# Patient Record
Sex: Female | Born: 1940 | Race: White | Hispanic: No | Smoking: Never smoker
Health system: Southern US, Community
[De-identification: ages and names within clinical notes are randomized; demographics above are authoritative.]

## PROBLEM LIST (undated history)

## (undated) DIAGNOSIS — M899 Disorder of bone, unspecified: Secondary | ICD-10-CM

## (undated) DIAGNOSIS — K759 Inflammatory liver disease, unspecified: Secondary | ICD-10-CM

## (undated) DIAGNOSIS — H9120 Sudden idiopathic hearing loss, unspecified ear: Secondary | ICD-10-CM

## (undated) DIAGNOSIS — L989 Disorder of the skin and subcutaneous tissue, unspecified: Secondary | ICD-10-CM

## (undated) DIAGNOSIS — M949 Disorder of cartilage, unspecified: Secondary | ICD-10-CM

## (undated) DIAGNOSIS — E785 Hyperlipidemia, unspecified: Secondary | ICD-10-CM

## (undated) DIAGNOSIS — Z9189 Other specified personal risk factors, not elsewhere classified: Secondary | ICD-10-CM

## (undated) DIAGNOSIS — M1990 Unspecified osteoarthritis, unspecified site: Secondary | ICD-10-CM

## (undated) HISTORY — DX: Other specified personal risk factors, not elsewhere classified: Z91.89

## (undated) HISTORY — PX: OTHER SURGICAL HISTORY: SHX169

## (undated) HISTORY — DX: Hyperlipidemia, unspecified: E78.5

## (undated) HISTORY — PX: ABDOMINAL HYSTERECTOMY: SHX81

## (undated) HISTORY — DX: Unspecified osteoarthritis, unspecified site: M19.90

## (undated) HISTORY — DX: Disorder of bone, unspecified: M89.9

## (undated) HISTORY — PX: BREAST EXCISIONAL BIOPSY: SUR124

## (undated) HISTORY — PX: OOPHORECTOMY: SHX86

## (undated) HISTORY — PX: TONSILLECTOMY: SUR1361

## (undated) HISTORY — DX: Inflammatory liver disease, unspecified: K75.9

## (undated) HISTORY — DX: Disorder of cartilage, unspecified: M94.9

## (undated) HISTORY — DX: Sudden idiopathic hearing loss, unspecified ear: H91.20

## (undated) HISTORY — DX: Disorder of the skin and subcutaneous tissue, unspecified: L98.9

---

## 2001-07-27 ENCOUNTER — Encounter: Admission: RE | Admit: 2001-07-27 | Discharge: 2001-07-27 | Payer: Self-pay | Admitting: *Deleted

## 2001-07-27 ENCOUNTER — Encounter: Payer: Self-pay | Admitting: *Deleted

## 2002-08-10 ENCOUNTER — Emergency Department (HOSPITAL_COMMUNITY): Admission: EM | Admit: 2002-08-10 | Discharge: 2002-08-10 | Payer: Self-pay | Admitting: Emergency Medicine

## 2003-09-25 ENCOUNTER — Encounter: Admission: RE | Admit: 2003-09-25 | Discharge: 2003-09-25 | Payer: Self-pay | Admitting: Internal Medicine

## 2004-10-15 ENCOUNTER — Encounter: Admission: RE | Admit: 2004-10-15 | Discharge: 2004-10-15 | Payer: Self-pay | Admitting: Internal Medicine

## 2005-08-29 LAB — HM COLONOSCOPY: HM Colonoscopy: NORMAL

## 2005-09-10 ENCOUNTER — Ambulatory Visit (HOSPITAL_COMMUNITY): Admission: RE | Admit: 2005-09-10 | Discharge: 2005-09-10 | Payer: Self-pay | Admitting: Gastroenterology

## 2005-10-16 ENCOUNTER — Encounter: Admission: RE | Admit: 2005-10-16 | Discharge: 2005-10-16 | Payer: Self-pay | Admitting: Internal Medicine

## 2006-10-08 IMAGING — RF DG BE W/ AIR HIGH DENSITY
10 series · 10 of 10 positions shown · non-contrast
Comparison: none

CLINICAL DATA: Incomplete colonoscopy. 
 AIR CONTRAST BARIUM ENEMA HIGH DENSITY:

[Series 1: run · 1 of 1 slices shown (1 of 10)]
[im 1/1]
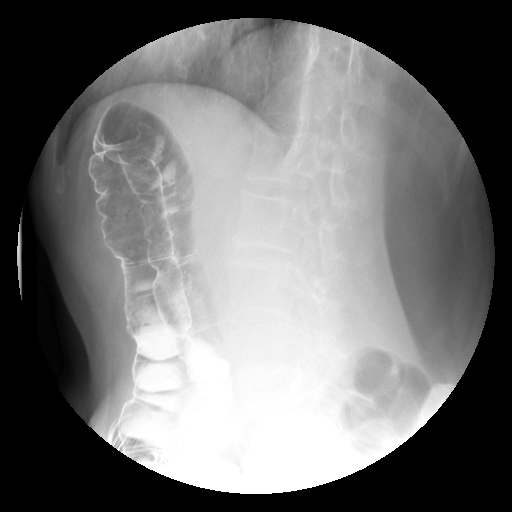

[Series 2: run · 1 of 1 slices shown (2 of 10)]
[im 1/1]
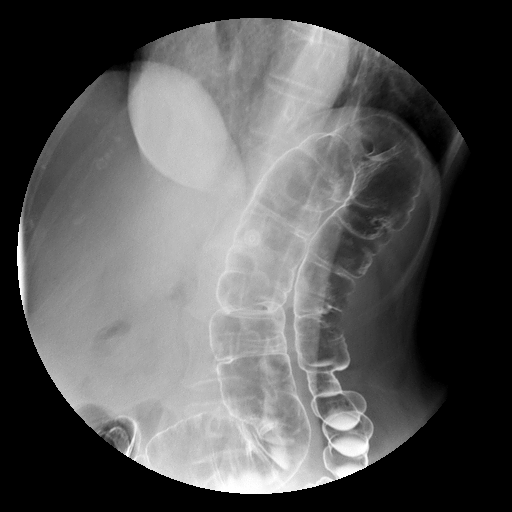

[Series 3: run · 1 of 1 slices shown (3 of 10)]
[im 1/1]
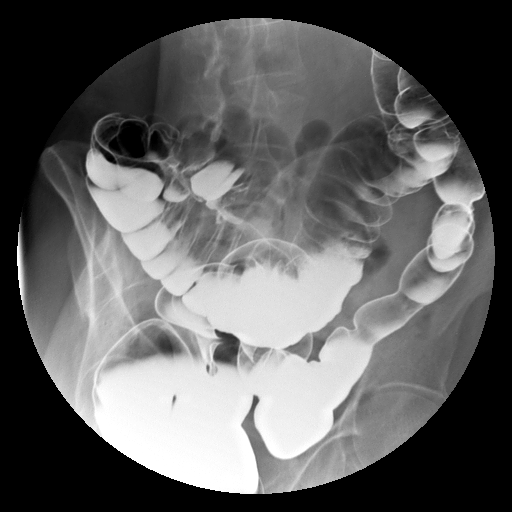

[Series 4: run · 1 of 1 slices shown (4 of 10)]
[im 1/1]
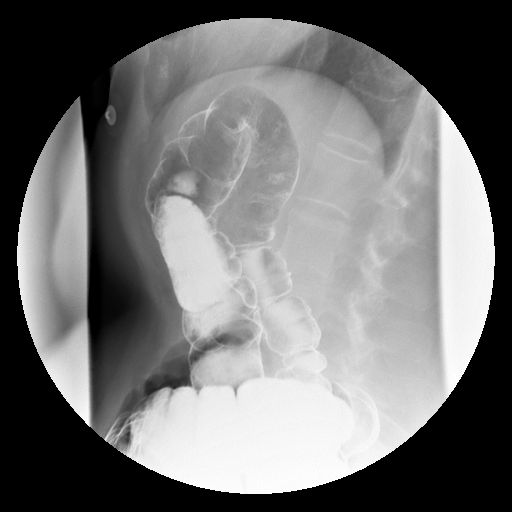

[Series 5: run · 1 of 1 slices shown (5 of 10)]
[im 1/1]
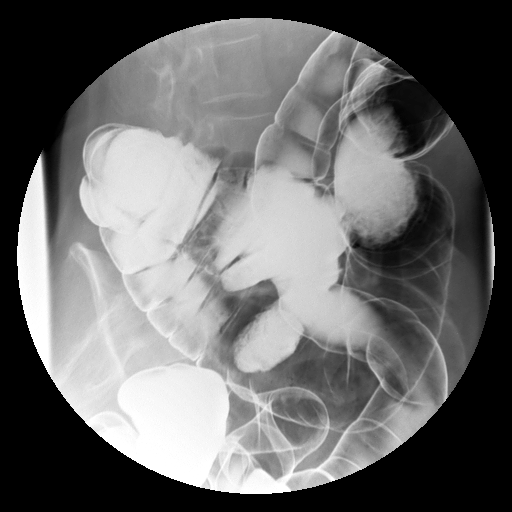

[Series 6: run · 1 of 1 slices shown (6 of 10)]
[im 1/1]
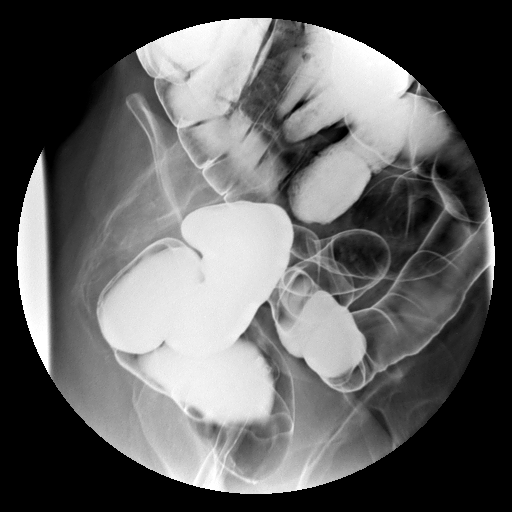

[Series 7: run · 1 of 1 slices shown (7 of 10)]
[im 1/1]
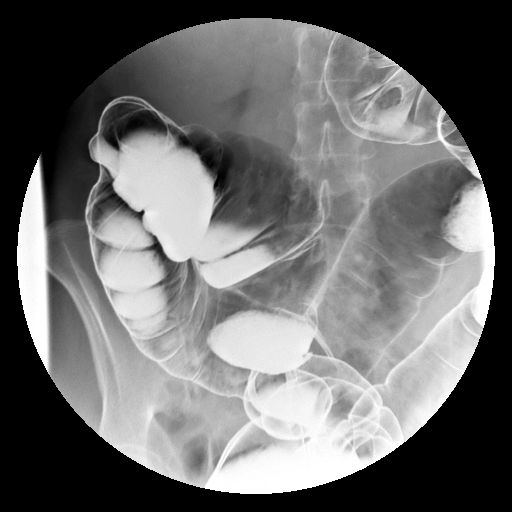

[Series 8: run · 1 of 1 slices shown (8 of 10)]
[im 1/1]
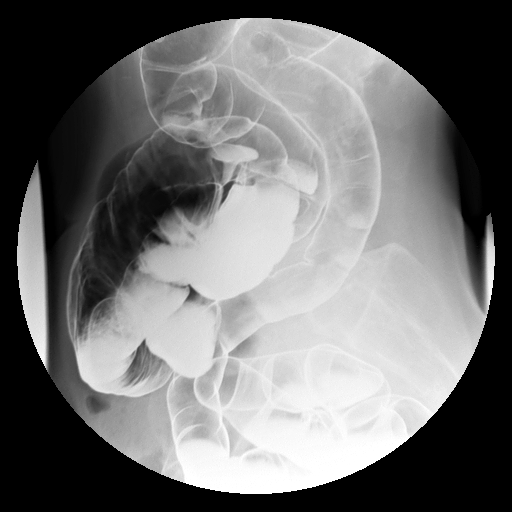

[Series 9: run · 1 of 1 slices shown (9 of 10)]
[im 1/1]
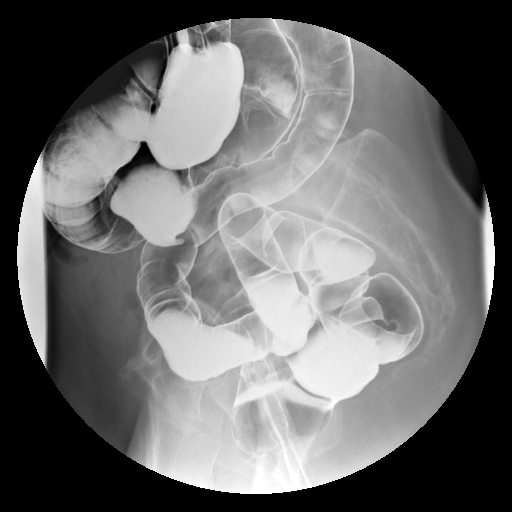

[Series 10: run · 1 of 1 slices shown (10 of 10)]
[im 1/1]
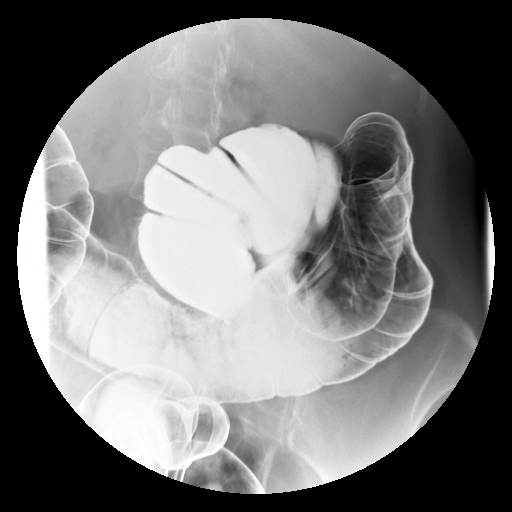

[10 of 10 positions shown; findings below may reference images not displayed]

FINDINGS: Flat and erect scout films prior to the introduction of contrast showed no evidence of free air within the abdomen.  There was considerable gas within the colon.  There is a 10 x 18 mm calcification associated with the left upper quadrant of the abdomen of uncertain etiology.  
 With the aid of fluoroscopic visualization, barium was retrogradely passed through the entire colon.  The cecum appears to be somewhat mobile.  There was no constricting lesion.  There were some filling defects which appeared to be probably retained stool.  
 The patient was allowed to evacuate most of the barium and air was introduced giving good air contrast visualization of the colon.  There was, on one or two pictures, a filling defect in the posterior aspect of the lower sigmoid just above the rectosigmoid junction and appears probably to be fecal material and certainly was visualized on the colonoscopy which was said to have passed to 70 cm.  There also is suggestion of one or two very small diverticula in the region of the lower sigmoid colon.  The cecum is seen to be mobile but is well visualized and appears normal.  There appeared to be no reflux of the terminal ileum.  
 Post evacuation films showed considerable retention of air and barium but no definite abnormality.
IMPRESSION: Few small widely scattered diverticula.  Some retained fecal material as noted above.  Mobile cecum.  No constricting lesion.  No reflux of the terminal ileum.

## 2006-10-20 ENCOUNTER — Encounter: Admission: RE | Admit: 2006-10-20 | Discharge: 2006-10-20 | Payer: Self-pay | Admitting: Internal Medicine

## 2007-06-08 ENCOUNTER — Ambulatory Visit: Payer: Self-pay | Admitting: Internal Medicine

## 2007-06-08 LAB — CONVERTED CEMR LAB
ALT: 17 units/L (ref 0–35)
BUN: 8 mg/dL (ref 6–23)
Basophils Absolute: 0 10*3/uL (ref 0.0–0.1)
Basophils Relative: 0.3 % (ref 0.0–1.0)
Calcium: 9.1 mg/dL (ref 8.4–10.5)
Chloride: 104 meq/L (ref 96–112)
Direct LDL: 99.9 mg/dL
Eosinophils Relative: 0.2 % (ref 0.0–5.0)
HDL: 88.2 mg/dL (ref >39.0)
Lymphocytes Relative: 29.7 % (ref 12.0–46.0)
MCV: 90.7 fL (ref 78.0–100.0)
Monocytes Relative: 9.4 % (ref 3.0–11.0)
Neutrophils Relative %: 60.4 % (ref 43.0–77.0)
RBC: 4.4 M/uL (ref 3.87–5.11)
RDW: 12.6 % (ref 11.5–14.6)
TSH: 0.87 microintl units/mL (ref 0.35–5.50)
Total CHOL/HDL Ratio: 2.3
Triglycerides: 47 mg/dL (ref 0–149)

## 2007-06-12 ENCOUNTER — Encounter: Payer: Self-pay | Admitting: Internal Medicine

## 2007-06-12 DIAGNOSIS — Z9189 Other specified personal risk factors, not elsewhere classified: Secondary | ICD-10-CM | POA: Insufficient documentation

## 2007-06-12 DIAGNOSIS — M159 Polyosteoarthritis, unspecified: Secondary | ICD-10-CM | POA: Insufficient documentation

## 2007-06-12 DIAGNOSIS — E785 Hyperlipidemia, unspecified: Secondary | ICD-10-CM

## 2007-06-12 DIAGNOSIS — M1990 Unspecified osteoarthritis, unspecified site: Secondary | ICD-10-CM

## 2007-06-12 DIAGNOSIS — K759 Inflammatory liver disease, unspecified: Secondary | ICD-10-CM | POA: Insufficient documentation

## 2007-06-12 DIAGNOSIS — G43909 Migraine, unspecified, not intractable, without status migrainosus: Secondary | ICD-10-CM | POA: Insufficient documentation

## 2007-06-12 HISTORY — DX: Unspecified osteoarthritis, unspecified site: M19.90

## 2007-06-12 HISTORY — DX: Other specified personal risk factors, not elsewhere classified: Z91.89

## 2007-06-12 HISTORY — DX: Hyperlipidemia, unspecified: E78.5

## 2007-06-12 HISTORY — DX: Inflammatory liver disease, unspecified: K75.9

## 2007-07-26 ENCOUNTER — Telehealth (INDEPENDENT_AMBULATORY_CARE_PROVIDER_SITE_OTHER): Payer: Self-pay | Admitting: *Deleted

## 2007-10-22 ENCOUNTER — Encounter: Admission: RE | Admit: 2007-10-22 | Discharge: 2007-10-22 | Payer: Self-pay | Admitting: Internal Medicine

## 2008-05-24 ENCOUNTER — Ambulatory Visit: Payer: Self-pay | Admitting: Internal Medicine

## 2008-05-24 DIAGNOSIS — M8580 Other specified disorders of bone density and structure, unspecified site: Secondary | ICD-10-CM

## 2008-05-24 DIAGNOSIS — M899 Disorder of bone, unspecified: Secondary | ICD-10-CM

## 2008-05-24 HISTORY — DX: Disorder of bone, unspecified: M89.9

## 2008-05-26 ENCOUNTER — Ambulatory Visit: Payer: Self-pay | Admitting: Internal Medicine

## 2008-05-26 ENCOUNTER — Encounter: Payer: Self-pay | Admitting: Internal Medicine

## 2008-06-13 ENCOUNTER — Telehealth (INDEPENDENT_AMBULATORY_CARE_PROVIDER_SITE_OTHER): Payer: Self-pay | Admitting: *Deleted

## 2008-07-06 ENCOUNTER — Encounter: Payer: Self-pay | Admitting: Internal Medicine

## 2008-10-23 ENCOUNTER — Encounter: Admission: RE | Admit: 2008-10-23 | Discharge: 2008-10-23 | Payer: Self-pay | Admitting: Internal Medicine

## 2009-07-03 ENCOUNTER — Ambulatory Visit: Payer: Self-pay | Admitting: Internal Medicine

## 2009-07-03 DIAGNOSIS — L989 Disorder of the skin and subcutaneous tissue, unspecified: Secondary | ICD-10-CM | POA: Insufficient documentation

## 2009-07-03 DIAGNOSIS — H9120 Sudden idiopathic hearing loss, unspecified ear: Secondary | ICD-10-CM

## 2009-07-03 HISTORY — DX: Sudden idiopathic hearing loss, unspecified ear: H91.20

## 2009-07-03 HISTORY — DX: Disorder of the skin and subcutaneous tissue, unspecified: L98.9

## 2009-07-25 ENCOUNTER — Encounter: Payer: Self-pay | Admitting: Internal Medicine

## 2009-10-25 ENCOUNTER — Encounter: Admission: RE | Admit: 2009-10-25 | Discharge: 2009-10-25 | Payer: Self-pay | Admitting: Internal Medicine

## 2009-10-25 LAB — HM MAMMOGRAPHY

## 2010-10-19 ENCOUNTER — Other Ambulatory Visit: Payer: Self-pay | Admitting: Internal Medicine

## 2010-10-19 DIAGNOSIS — Z0000 Encounter for general adult medical examination without abnormal findings: Secondary | ICD-10-CM

## 2010-10-21 ENCOUNTER — Ambulatory Visit
Admission: RE | Admit: 2010-10-21 | Discharge: 2010-10-21 | Payer: Self-pay | Source: Home / Self Care | Attending: Internal Medicine | Admitting: Internal Medicine

## 2010-10-21 ENCOUNTER — Encounter: Payer: Self-pay | Admitting: Internal Medicine

## 2010-10-27 LAB — CONVERTED CEMR LAB
AST: 20 units/L (ref 0–37)
BUN: 12 mg/dL (ref 6–23)
Basophils Relative: 0.9 % (ref 0.0–3.0)
Bilirubin Urine: NEGATIVE
Bilirubin, Direct: 0.1 mg/dL (ref 0.0–0.3)
CO2: 28 meq/L (ref 19–32)
Chloride: 106 meq/L (ref 96–112)
Cholesterol: 213 mg/dL — ABNORMAL HIGH (ref 0–200)
Creatinine, Ser: 0.8 mg/dL (ref 0.4–1.2)
Direct LDL: 113.8 mg/dL
Eosinophils Absolute: 0 10*3/uL (ref 0.0–0.7)
Eosinophils Relative: 0.2 % (ref 0.0–5.0)
GFR calc non Af Amer: 75.67 mL/min (ref >60)
Ketones, ur: 40 mg/dL
MCHC: 34.4 g/dL (ref 30.0–36.0)
MCV: 91.5 fL (ref 78.0–100.0)
Monocytes Absolute: 0.5 10*3/uL (ref 0.1–1.0)
Monocytes Relative: 7.5 % (ref 3.0–12.0)
Neutro Abs: 4.6 10*3/uL (ref 1.4–7.7)
Neutrophils Relative %: 67.4 % (ref 43.0–77.0)
RBC: 4.55 M/uL (ref 3.87–5.11)
Specific Gravity, Urine: >=1.03 (ref 1.000–1.030)
Total Protein: 7.8 g/dL (ref 6.0–8.3)
Triglycerides: 53 mg/dL (ref 0.0–149.0)

## 2010-10-30 ENCOUNTER — Ambulatory Visit
Admission: RE | Admit: 2010-10-30 | Discharge: 2010-10-30 | Disposition: A | Discharge status: Home / Self Care | Payer: 59 | Source: Ambulatory Visit | Attending: Internal Medicine | Admitting: Internal Medicine

## 2010-10-30 DIAGNOSIS — Z0000 Encounter for general adult medical examination without abnormal findings: Secondary | ICD-10-CM

## 2010-10-31 NOTE — Miscellaneous (Signed)
Summary: BONE DENSITY  Clinical Lists Changes  Orders: Added new Test order of T-Bone Densitometry (77080) - Signed Added new Test order of T-Lumbar Vertebral Assessment (77082) - Signed 

## 2011-02-14 NOTE — Op Note (Signed)
NAMECARREN, BLAKLEY NO.:  [id number]   MEDICAL RECORD NO.:  [id number]          PATIENT TYPE:  AMB   LOCATION:  ENDO                         FACILITY:  [facility]   PHYSICIAN:  Danise Edge, M.D.   DATE OF BIRTH:  09-04-1941   DATE OF PROCEDURE:  09/10/2005  DATE OF DISCHARGE:                                 OPERATIVE REPORT   PROCEDURE:  Flexible proctosigmoidoscopy.   INDICATIONS FOR PROCEDURE:  Ms. Marilouise Densmore is a 70 year old female born  [birth year].  Ms. Longshore was scheduled to undergo a screening  colonoscopy with polypectomy to prevent colon cancer.  Due to the colonic  loop formation, a flexible proctosigmoidoscopy was performed.   ENDOSCOPIST:  Danise Edge, M.D.   PREMEDICATION:  Versed 5 mg, fentanyl 50 mcg.   PROCEDURE:  After obtaining informed consent, Ms. Burkman was placed in the  left lateral decubitus position.  I administered intravenous Demerol and  intravenous Versed to achieve conscious sedation for the procedure.  The  patient's blood pressure, oxygen saturation, and cardiac rhythm were  monitored throughout the procedure and documented in the medical record.   Anal inspection and digital rectal exam was normal.  The Olympus adjustable  pediatric colonoscope was introduced into the rectum and advanced to 70 cm  from the anal verge.  I did not reach the splenic flexure due to colonic  loop formation, which could not be controlled by applying external abdominal  pressure and rolling the patient from a lateral decubitus position to the  supine position and finally the right lateral decubitus position.  The  procedure was terminated without performing a complete colonoscopy.  Colonic  preparation for the exam today was excellent.   Endoscopic appearance of the rectum and left colon with the colonoscope  advanced to 70 cm from the anal verge was completely normal.  There was no  endoscopic evidence for the presence of colorectal  neoplasia or colonic  diverticulosis.   ASSESSMENT:  Normal screening proctocolonoscopy to 70 cm.  Air contrast  barium enema to follow.           ______________________________  Danise Edge, M.D.     MJ/MEDQ  D:  09/10/2005  T:  09/10/2005  Job:  [id number]   cc:   Ladell Pier, M.D.  Fax: [phone]

## 2011-04-29 DIAGNOSIS — G43909 Migraine, unspecified, not intractable, without status migrainosus: Secondary | ICD-10-CM

## 2011-05-04 ENCOUNTER — Encounter: Payer: Self-pay | Admitting: Internal Medicine

## 2011-05-04 DIAGNOSIS — Z0000 Encounter for general adult medical examination without abnormal findings: Secondary | ICD-10-CM | POA: Insufficient documentation

## 2011-05-04 DIAGNOSIS — Z0001 Encounter for general adult medical examination with abnormal findings: Secondary | ICD-10-CM | POA: Insufficient documentation

## 2011-05-05 ENCOUNTER — Ambulatory Visit (INDEPENDENT_AMBULATORY_CARE_PROVIDER_SITE_OTHER): Payer: 59 | Admitting: Internal Medicine

## 2011-05-05 ENCOUNTER — Other Ambulatory Visit: Payer: Self-pay | Admitting: Internal Medicine

## 2011-05-05 ENCOUNTER — Encounter: Payer: Self-pay | Admitting: Internal Medicine

## 2011-05-05 ENCOUNTER — Other Ambulatory Visit (INDEPENDENT_AMBULATORY_CARE_PROVIDER_SITE_OTHER): Payer: 59

## 2011-05-05 VITALS — BP 110/80 | HR 70 | Temp 97.8°F | Ht 62.0 in | Wt 133.4 lb

## 2011-05-05 DIAGNOSIS — Z0000 Encounter for general adult medical examination without abnormal findings: Secondary | ICD-10-CM

## 2011-05-05 DIAGNOSIS — Z23 Encounter for immunization: Secondary | ICD-10-CM

## 2011-05-05 DIAGNOSIS — M899 Disorder of bone, unspecified: Secondary | ICD-10-CM

## 2011-05-05 LAB — HEPATIC FUNCTION PANEL
ALT: 19 U/L (ref 0–35)
Albumin: 4.7 g/dL (ref 3.5–5.2)
Alkaline Phosphatase: 53 U/L (ref 39–117)

## 2011-05-05 LAB — CBC WITH DIFFERENTIAL/PLATELET
Basophils Absolute: 0 10*3/uL (ref 0.0–0.1)
Eosinophils Absolute: 0 10*3/uL (ref 0.0–0.7)
Eosinophils Relative: 0.3 % (ref 0.0–5.0)
HCT: 41.4 % (ref 36.0–46.0)
Lymphocytes Relative: 26 % (ref 12.0–46.0)
Lymphs Abs: 1.8 10*3/uL (ref 0.7–4.0)
MCHC: 34 g/dL (ref 30.0–36.0)
MCV: 91.8 fl (ref 78.0–100.0)
Monocytes Relative: 7.6 % (ref 3.0–12.0)
Platelets: 226 10*3/uL (ref 150.0–400.0)
RBC: 4.51 Mil/uL (ref 3.87–5.11)
RDW: 13.2 % (ref 11.5–14.6)

## 2011-05-05 LAB — LIPID PANEL
Cholesterol: 214 mg/dL — ABNORMAL HIGH (ref 0–200)
HDL: 104.1 mg/dL (ref >39.00)
Total CHOL/HDL Ratio: 2
Triglycerides: 51 mg/dL (ref 0.0–149.0)

## 2011-05-05 LAB — URINALYSIS, ROUTINE W REFLEX MICROSCOPIC
Ketones, ur: 40
Urine Glucose: NEGATIVE
Urobilinogen, UA: 0.2 (ref 0.0–1.0)

## 2011-05-05 LAB — BASIC METABOLIC PANEL
BUN: 11 mg/dL (ref 6–23)
Creatinine, Ser: 0.9 mg/dL (ref 0.4–1.2)
GFR: 70.18 mL/min (ref >60.00)
Glucose, Bld: 84 mg/dL (ref 70–99)
Sodium: 134 mEq/L — ABNORMAL LOW (ref 135–145)

## 2011-05-05 LAB — TSH: TSH: 0.77 u[IU]/mL (ref 0.35–5.50)

## 2011-05-05 MED ORDER — PNEUMOCOCCAL VAC POLYVALENT 25 MCG/0.5ML IJ INJ: 0.5000 mL | INJECTION | Freq: Once | INTRAMUSCULAR | Status: DC

## 2011-05-05 NOTE — Assessment & Plan Note (Signed)

## 2011-05-05 NOTE — Patient Instructions (Addendum)
Please consider Prolia for the bone loss, and call if you would like [place] to see if your insurance will cover. Please return in the next month or so for your yearly blood work.  Please call the phone number [phone] (the [employer] System) for results of testing in 2-3 days;  When calling, simply dial the number, and when prompted enter the MRN number above (the Medical Record Number) and the # key, then the message should start. You had the pneumonia shot today Your EKG was OK today Your left ear was irrigated today Please return in 1 year for your yearly visit, or sooner if needed, with Lab testing done 3-5 days before

## 2011-05-05 NOTE — Progress Notes (Signed)
Subjective:    Patient ID: Erica Patterson, female    DOB: 04-21-1941, 70 y.o.   MRN: [id number]  HPI Here for wellness and f/u;  Overall doing ok;  Pt denies CP, worsening SOB, DOE, wheezing, orthopnea, PND, worsening LE edema, palpitations, dizziness or syncope.  Pt denies neurological change such as new Headache, facial or extremity weakness.  Pt denies polydipsia, polyuria, or low sugar symptoms. Pt states overall good compliance with treatment and medications, good tolerability, and trying to follow lower cholesterol diet.  Pt denies worsening depressive symptoms, suicidal ideation or panic. No fever, wt loss, night sweats, loss of appetite, or other constitutional symptoms.  Pt states good ability with ADL's, low fall risk, home safety reviewed and adequate, no significant changes in hearing or vision, and occasionally active with exercise.  Cannot take fosamax due to esophageal issue and concern about her eyes and FH of macular degeneration.  Has some left ear hearing loss - s/p wax removed in the past , ? recurred Past Medical History  Diagnosis Date  . CHICKENPOX, HX OF 06/12/2007  . HEPATITIS NOS 06/12/2007  . HYPERLIPIDEMIA 06/12/2007  . OSTEOARTHRITIS 06/12/2007  . OSTEOPENIA 05/24/2008  . SKIN LESION 07/03/2009  . UNSPECIFIED SUDDEN HEARING LOSS 07/03/2009   Past Surgical History  Procedure Date  . Abdominal hysterectomy   . Tonsillectomy   . Breast biopsy   . Oophorectomy     1 ovary    reports that she has never smoked. She does not have any smokeless tobacco history on file. She reports that she does not drink alcohol. Her drug history not on file. family history includes Coronary artery disease in her other; Diabetes in her other; and Hypertension in her other. Allergies  Allergen Reactions  . Fosamax    Current Outpatient Prescriptions on File Prior to Visit  Medication Sig Dispense Refill  . aspirin 81 MG tablet Take 81 mg by mouth daily.        . Calcium Carbonate-Vitamin D  (CALCIUM + D PO) Take by mouth 2 (two) times daily.         Review of Systems Review of Systems  Constitutional: Negative for diaphoresis, activity change, appetite change and unexpected weight change.  HENT: Negative for hearing loss, ear pain, facial swelling, mouth sores and neck stiffness.   Eyes: Negative for pain, redness and visual disturbance.  Respiratory: Negative for shortness of breath and wheezing.   Cardiovascular: Negative for chest pain and palpitations.  Gastrointestinal: Negative for diarrhea, blood in stool, abdominal distention and rectal pain.  Genitourinary: Negative for hematuria, flank pain and decreased urine volume.  Musculoskeletal: Negative for myalgias and joint swelling.  Skin: Negative for color change and wound.  Neurological: Negative for syncope and numbness.  Hematological: Negative for adenopathy.  Psychiatric/Behavioral: Negative for hallucinations, self-injury, decreased concentration and agitation.      Objective:   Physical Exam BP 110/80  Pulse 70  Temp(Src) 97.8 F (36.6 C) (Oral)  Ht 5\' 2"  (1.575 m)  Wt 133 lb 6 oz (60.499 kg)  BMI 24.39 kg/m2  SpO2 95% Physical Exam  VS noted Constitutional: Pt is oriented to person, place, and time. Appears well-developed and well-nourished.  HENT:  Head: Normocephalic and atraumatic.  Right Ear: External ear normal.  Left Ear: External ear normal.  Left TM ok after wax removed today Nose: Nose normal.  Mouth/Throat: Oropharynx is clear and moist.  Eyes: Conjunctivae and EOM are normal. Pupils are equal, round, and reactive to light.  Neck: Normal range of motion. Neck supple. No JVD present. No tracheal deviation present.  Cardiovascular: Normal rate, regular rhythm, normal heart sounds and intact distal pulses.   Pulmonary/Chest: Effort normal and breath sounds normal.  Abdominal: Soft. Bowel sounds are normal. There is no tenderness.  Musculoskeletal: Normal range of motion. Exhibits no edema.    Lymphadenopathy:  Has no cervical adenopathy.  Neurological: Pt is alert and oriented to person, place, and time. Pt has normal reflexes. No cranial nerve deficit.  Skin: Skin is warm and dry. No rash noted.  Psychiatric:  Has  normal mood and affect. Behavior is normal.        Assessment & Plan:

## 2011-05-05 NOTE — Assessment & Plan Note (Signed)
Very close to osteoporosis by dxa, to consider prolia, pt to call

## 2011-05-09 ENCOUNTER — Telehealth: Payer: Self-pay

## 2011-05-09 MED ORDER — DOXYCYCLINE HYCLATE 100 MG PO TABS: 100.0000 mg | ORAL_TABLET | Freq: Two times a day (BID) | ORAL | Status: AC

## 2011-05-09 NOTE — Telephone Encounter (Signed)
Pt states that redness and swelling have gone but there is some minor rash left. Pt advised of ABX and will continue to monitor area.

## 2011-05-09 NOTE — Telephone Encounter (Signed)
Pt called to inform MD that she has a red, warm swollen rash on her arm after receiving Pneumovax at [facility]. Pt says she has had this injection before with no adverse reaction.

## 2011-05-09 NOTE — Telephone Encounter (Signed)
This is most likely a small infection at the injection site, which is unusual, but has occurred with other patients as well  She should take course of antibx so as not to get worse  Done per emr

## 2011-07-21 ENCOUNTER — Telehealth: Payer: Self-pay

## 2011-07-21 NOTE — Telephone Encounter (Signed)
Fluvirin .5 ml Left deltoid IM Manufacturer [employer] Date Administered 07/17/2011 Lot Number [id number] Expiration date 01/28/2012 [facility] [address] [place]

## 2011-09-29 ENCOUNTER — Other Ambulatory Visit: Payer: Self-pay | Admitting: Internal Medicine

## 2011-09-29 DIAGNOSIS — Z1231 Encounter for screening mammogram for malignant neoplasm of breast: Secondary | ICD-10-CM

## 2011-11-03 ENCOUNTER — Ambulatory Visit
Admission: RE | Admit: 2011-11-03 | Discharge: 2011-11-03 | Disposition: A | Discharge status: Home / Self Care | Payer: 59 | Source: Ambulatory Visit | Attending: Internal Medicine | Admitting: Internal Medicine

## 2011-11-03 DIAGNOSIS — Z1231 Encounter for screening mammogram for malignant neoplasm of breast: Secondary | ICD-10-CM

## 2012-06-25 ENCOUNTER — Encounter: Payer: Self-pay | Admitting: Internal Medicine

## 2012-06-25 ENCOUNTER — Other Ambulatory Visit (INDEPENDENT_AMBULATORY_CARE_PROVIDER_SITE_OTHER): Payer: Medicare HMO

## 2012-06-25 ENCOUNTER — Ambulatory Visit (INDEPENDENT_AMBULATORY_CARE_PROVIDER_SITE_OTHER): Payer: 59 | Admitting: Internal Medicine

## 2012-06-25 VITALS — BP 117/78 | HR 78 | Temp 97.0°F | Ht 62.5 in | Wt 135.5 lb

## 2012-06-25 DIAGNOSIS — M130 Polyarthritis, unspecified: Secondary | ICD-10-CM | POA: Insufficient documentation

## 2012-06-25 DIAGNOSIS — Z0000 Encounter for general adult medical examination without abnormal findings: Secondary | ICD-10-CM

## 2012-06-25 DIAGNOSIS — E785 Hyperlipidemia, unspecified: Secondary | ICD-10-CM

## 2012-06-25 DIAGNOSIS — Z23 Encounter for immunization: Secondary | ICD-10-CM

## 2012-06-25 LAB — CBC WITH DIFFERENTIAL/PLATELET
Basophils Relative: 0.8 % (ref 0.0–3.0)
Eosinophils Absolute: 0 10*3/uL (ref 0.0–0.7)
Eosinophils Relative: 0.2 % (ref 0.0–5.0)
HCT: 39.1 % (ref 36.0–46.0)
Hemoglobin: 13.2 g/dL (ref 12.0–15.0)
Lymphs Abs: 1.9 10*3/uL (ref 0.7–4.0)
MCHC: 33.7 g/dL (ref 30.0–36.0)
MCV: 91.8 fl (ref 78.0–100.0)
Monocytes Absolute: 0.5 10*3/uL (ref 0.1–1.0)
Neutro Abs: 4.3 10*3/uL (ref 1.4–7.7)
Neutrophils Relative %: 63.9 % (ref 43.0–77.0)
RBC: 4.26 Mil/uL (ref 3.87–5.11)
WBC: 6.8 10*3/uL (ref 4.5–10.5)

## 2012-06-25 LAB — URINALYSIS, ROUTINE W REFLEX MICROSCOPIC
Bilirubin Urine: NEGATIVE
Nitrite: NEGATIVE
Total Protein, Urine: NEGATIVE
Urine Glucose: NEGATIVE
pH: 5.5 (ref 5.0–8.0)

## 2012-06-25 LAB — LIPID PANEL
Cholesterol: 239 mg/dL — ABNORMAL HIGH (ref 0–200)
HDL: 97.5 mg/dL (ref >39.00)
Total CHOL/HDL Ratio: 2
VLDL: 11 mg/dL (ref 0.0–40.0)

## 2012-06-25 LAB — BASIC METABOLIC PANEL
BUN: 15 mg/dL (ref 6–23)
Calcium: 9 mg/dL (ref 8.4–10.5)
Chloride: 103 mEq/L (ref 96–112)
Creatinine, Ser: 0.7 mg/dL (ref 0.4–1.2)
GFR: 84.72 mL/min (ref >60.00)

## 2012-06-25 LAB — HEPATIC FUNCTION PANEL
Bilirubin, Direct: 0.1 mg/dL (ref 0.0–0.3)
Total Bilirubin: 0.8 mg/dL (ref 0.3–1.2)

## 2012-06-25 LAB — URIC ACID: Uric Acid, Serum: 4.7 mg/dL (ref 2.4–7.0)

## 2012-06-25 MED ORDER — PREDNISONE 10 MG PO TABS: ORAL_TABLET | ORAL | Status: DC

## 2012-06-25 MED ORDER — METHYLPREDNISOLONE ACETATE 80 MG/ML IJ SUSP
120.0000 mg | Freq: Once | INTRAMUSCULAR | Status: AC
  Administered 2012-06-25: 120 mg via INTRAMUSCULAR

## 2012-06-25 NOTE — Patient Instructions (Addendum)
You had the flu and tetanus shots today You had the steroid shot today Take all new medications as prescribed - the prednisone Continue all other medications as before Please go to LAB in the Basement for the blood and/or urine tests to be done today You will be contacted by phone if any changes need to be made immediately.  Otherwise, you will receive a letter about your results with an explanation. Please remember to sign up for My Chart at your earliest convenience, as this will be important to you in the future with finding out test results. Please call if not improved in 1-2 wks for orthopedic referral Please return in 1 year for your yearly visit, or sooner if needed, with Lab testing done 3-5 days before

## 2012-06-25 NOTE — Assessment & Plan Note (Signed)
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

## 2012-06-25 NOTE — Assessment & Plan Note (Signed)

## 2012-06-25 NOTE — Progress Notes (Signed)
Subjective:    Patient ID: Erica Patterson, female    DOB: August 20, 1941, 71 y.o.   MRN: [id number]  HPI  Here for wellness and f/u;  Overall doing ok;  Pt denies CP, worsening SOB, DOE, wheezing, orthopnea, PND, worsening LE edema, palpitations, dizziness or syncope.  Pt denies neurological change such as new Headache, facial or extremity weakness.  Pt denies polydipsia, polyuria, or low sugar symptoms. Pt states overall good compliance with treatment and medications, good tolerability, and trying to follow lower cholesterol diet.  Pt denies worsening depressive symptoms, suicidal ideation or panic. No fever, wt loss, night sweats, loss of appetite, or other constitutional symptoms.  Pt states good ability with ADL's, low fall risk, home safety reviewed and adequate, no significant changes in hearing or vision, and occasionally active with exercise. Also with c/o 2 wks sudden mod to severe mult joint pain including bilat knees (left > right), bilat toes (worse to left great toe with transient swelling), right lower back with radiation right lateral hip area.  Incidnetly Better today.   Pt denies fever, wt loss, night sweats, loss of appetite, or other constitutional symptoms Past Medical History  Diagnosis Date  . CHICKENPOX, HX OF 06/12/2007  . HEPATITIS NOS 06/12/2007  . HYPERLIPIDEMIA 06/12/2007  . OSTEOARTHRITIS 06/12/2007  . OSTEOPENIA 05/24/2008  . SKIN LESION 07/03/2009  . UNSPECIFIED SUDDEN HEARING LOSS 07/03/2009   Past Surgical History  Procedure Date  . Abdominal hysterectomy   . Tonsillectomy   . Breast biopsy   . Oophorectomy     1 ovary    reports that she has never smoked. She does not have any smokeless tobacco history on file. She reports that she does not drink alcohol. Her drug history not on file. family history includes Coronary artery disease in her other; Diabetes in her other; and Hypertension in her other. Allergies  Allergen Reactions  . Alendronate Sodium    Current  Outpatient Prescriptions on File Prior to Visit  Medication Sig Dispense Refill  . aspirin 81 MG tablet Take 81 mg by mouth daily.        . Calcium Carbonate-Vitamin D (CALCIUM + D PO) Take by mouth 2 (two) times daily.         Current Facility-Administered Medications on File Prior to Visit  Medication Dose Route Frequency Provider Last Rate Last Dose  . pneumococcal 23 valent vaccine (PNU-IMMUNE) injection 0.5 mL  0.5 mL Intramuscular Once Corwin Levins, MD       Review of Systems Review of Systems  Constitutional: Negative for diaphoresis, activity change, appetite change and unexpected weight change.  HENT: Negative for hearing loss, ear pain, facial swelling, mouth sores and neck stiffness.   Eyes: Negative for pain, redness and visual disturbance.  Respiratory: Negative for shortness of breath and wheezing.   Cardiovascular: Negative for chest pain and palpitations.  Gastrointestinal: Negative for diarrhea, blood in stool, abdominal distention and rectal pain.  Genitourinary: Negative for hematuria, flank pain and decreased urine volume.  Musculoskeletal: Negative for myalgias and joint swelling.  Skin: Negative for color change and wound.  Neurological: Negative for syncope and numbness.  Hematological: Negative for adenopathy.  Psychiatric/Behavioral: Negative for hallucinations, self-injury, decreased concentration and agitation.      Objective:   Physical Exam BP 117/78  Pulse 78  Temp 97 F (36.1 C) (Oral)  Ht 5' 2.5" (1.588 m)  Wt 135 lb 8 oz (61.462 kg)  BMI 24.39 kg/m2  SpO2 95% Physical Exam  VS noted Constitutional: Pt is oriented to person, place, and time. Appears well-developed and well-nourished.  HENT:  Head: Normocephalic and atraumatic.  Right Ear: External ear normal.  Left Ear: External ear normal.  Nose: Nose normal.  Mouth/Throat: Oropharynx is clear and moist.  Eyes: Conjunctivae and EOM are normal. Pupils are equal, round, and reactive to light.    Neck: Normal range of motion. Neck supple. No JVD present. No tracheal deviation present.  Cardiovascular: Normal rate, regular rhythm, normal heart sounds and intact distal pulses.   Pulmonary/Chest: Effort normal and breath sounds normal.  Abdominal: Soft. Bowel sounds are normal. There is no tenderness.  Musculoskeletal: Normal range of motion. Exhibits no edema.  Lymphadenopathy:  Has no cervical adenopathy.  Neurological: Pt is alert and oriented to person, place, and time. Pt has normal reflexes. No cranial nerve deficit.  Skin: Skin is warm and dry. No rash noted.  Psychiatric:  Has  normal mood and affect. Behavior is normal.  Bilat knees with FROM, NT, and trace left knee effusion Bilat first MTP's mild tender, no swelling Spine/right lateral Hip NT    Assessment & Plan:

## 2012-06-29 ENCOUNTER — Encounter: Payer: Self-pay | Admitting: Internal Medicine

## 2012-07-02 LAB — ANTI-DNA ANTIBODY, DOUBLE-STRANDED: ds DNA Ab: <1 IU/mL (ref <30)

## 2012-09-14 ENCOUNTER — Ambulatory Visit (INDEPENDENT_AMBULATORY_CARE_PROVIDER_SITE_OTHER): Payer: 59 | Admitting: Internal Medicine

## 2012-09-14 ENCOUNTER — Encounter: Payer: Self-pay | Admitting: Internal Medicine

## 2012-09-14 VITALS — BP 132/82 | HR 82 | Temp 97.9°F

## 2012-09-14 DIAGNOSIS — M25569 Pain in unspecified knee: Secondary | ICD-10-CM

## 2012-09-14 MED ORDER — METHYLPREDNISOLONE ACETATE 80 MG/ML IJ SUSP
120.0000 mg | Freq: Once | INTRAMUSCULAR | Status: AC
  Administered 2012-09-14: 120 mg via INTRAMUSCULAR

## 2012-09-14 MED ORDER — PREDNISONE 20 MG PO TABS: ORAL_TABLET | ORAL | Status: DC

## 2012-09-14 NOTE — Progress Notes (Signed)
Subjective:    Patient ID: Erica Patterson, female    DOB: Oct 20, 1940, 71 y.o.   MRN: [id number]  HPI  Pt presents to the clinic with c/o knee pain. This originally started 1 month ago after a fall. The pain and swelling finally subsided but then returned after a recent visit to [place] for sightseeing. She is having pain that is only mildly relieved by ibuprofen. The swelling subsides with elevation and placement of ice.  Review of Systems      Past Medical History  Diagnosis Date  . CHICKENPOX, HX OF 06/12/2007  . HEPATITIS NOS 06/12/2007  . HYPERLIPIDEMIA 06/12/2007  . OSTEOARTHRITIS 06/12/2007  . OSTEOPENIA 05/24/2008  . SKIN LESION 07/03/2009  . UNSPECIFIED SUDDEN HEARING LOSS 07/03/2009    Current Outpatient Prescriptions  Medication Sig Dispense Refill  . aspirin 81 MG tablet Take 81 mg by mouth daily.        . Calcium Carbonate-Vitamin D (CALCIUM + D PO) Take by mouth 2 (two) times daily.        . predniSONE (DELTASONE) 20 MG tablet Take 3 tablets today (60 mg) on days 1-3, take 2 tablets today (40 mg) on days 4-6, take 1 tablet (20 mg) on days 7-9, take 1/2 tablets (10 mg) on days 10-12  21 tablet  1   Current Facility-Administered Medications  Medication Dose Route Frequency Provider Last Rate Last Dose  . pneumococcal 23 valent vaccine (PNU-IMMUNE) injection 0.5 mL  0.5 mL Intramuscular Once Corwin Levins, MD        Allergies  Allergen Reactions  . Alendronate Sodium     Family History  Problem Relation Age of Onset  . Coronary artery disease Other     female 1st degree relative, female 1st degree relative  . Diabetes Other     1st degree relative  . Hypertension Other     History   Social History  . Marital Status: Widowed    Spouse Name: N/A    Number of Children: 3  . Years of Education: N/A   Occupational History  . 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    Social History Main Topics  . Smoking status: Never Smoker   . Smokeless tobacco: Not on file  . Alcohol Use: No   . Drug Use: Not on file  . Sexually Active: Not on file   Other Topics Concern  . Not on file   Social History Narrative  . No narrative on file     Constitutional: Denies fever, malaise, fatigue, headache or abrupt weight changes.  Musculoskeletal: Pt reports left knee pain and swelling. Denies decrease in range of motion, difficulty with gait, muscle pain Neurological: Denies dizziness, difficulty with memory, difficulty with speech or problems with balance and coordination.   No other specific complaints in a complete review of systems (except as listed in HPI above).  Objective:   Physical Exam   BP 132/82  Pulse 82  Temp 97.9 F (36.6 C) (Oral)  SpO2 97% Wt Readings from Last 3 Encounters:  06/25/12 135 lb 8 oz (61.462 kg)  05/05/11 133 lb 6 oz (60.499 kg)  07/03/09 129 lb (58.514 kg)    General: Appears her stated age, well developed, well nourished in NAD. Cardiovascular: Normal rate and rhythm. S1,S2 noted.  No murmur, rubs or gallops noted. No JVD or BLE edema. No carotid bruits noted. Pulmonary/Chest: Normal effort and positive vesicular breath sounds. No respiratory distress. No wheezes, rales or ronchi noted.  Musculoskeletal:  Pinpoint tenderness of the pes bursa on both sides of the left knee. No swelling noted. Normal range of motion. No difficulty with gait.         Assessment & Plan:   Pes Bursitis, new onset with additional workup required:  Will give IM depo medrol 120 mg today Will give pred pack May need knee injection if not better  RTC as needed or if symptoms persist

## 2012-09-14 NOTE — Patient Instructions (Addendum)
Bursitis  Bursitis is a swelling and soreness (inflammation) of a fluid-filled sac (bursa) that overlies and protects a joint. It can be caused by injury, overuse of the joint, arthritis or infection. The joints most likely to be affected are the elbows, shoulders, hips and knees.  HOME CARE INSTRUCTIONS   · Apply ice to the affected area for 15 to 20 minutes each hour while awake for 2 days. Put the ice in a plastic bag and place a towel between the bag of ice and your skin.  · Rest the injured joint as much as possible, but continue to put the joint through a full range of motion, 4 times per day. (The shoulder joint especially becomes rapidly "frozen" if not used.) When the pain lessens, begin normal slow movements and usual activities.  · Only take over-the-counter or prescription medicines for pain, discomfort or fever as directed by your caregiver.  · Your caregiver may recommend draining the bursa and injecting medicine into the bursa. This may help the healing process.  · Follow all instructions for follow-up with your caregiver. This includes any orthopedic referrals, physical therapy and rehabilitation. Any delay in obtaining necessary care could result in a delay or failure of the bursitis to heal and chronic pain.  SEEK IMMEDIATE MEDICAL CARE IF:   · Your pain increases even during treatment.  · You develop an oral temperature above 102° F (38.9° C) and have heat and inflammation over the involved bursa.  MAKE SURE YOU:   · Understand these instructions.  · Will watch your condition.  · Will get help right away if you are not doing well or get worse.  Document Released: 09/12/2000 Document Revised: 12/08/2011 Document Reviewed: 08/17/2009  ExitCare® Patient Information ©2013 [employer].  Prepatellar Bursitis  with Rehab   Bursitis is a condition that is characterized by inflammation of a bursa. Bursa exists in many areas of the body. They are fluid filled sacs that lie between a soft tissue (skin,  tendon, or ligament) and a bone, and they reduce friction between the structures as well as the stress placed on the soft tissue. Prepatellar bursitis is inflammation of the bursa that lies between the skin and the kneecap (patella). This condition often causes pain over the patella.  SYMPTOMS   · Pain, tenderness, and/or inflammation over the patella.  · Pain that worsens with movement of the knee joint.  · Decreased range of motion for the knee joint.  · A crackling sound (crepitation) when the bursa is moved or touched.  · Occasionally, painless swelling of the bursa.  · Fever (when infected).  CAUSES   Bursitis is caused by damage to the bursa, which results in an inflammatory response. Common mechanisms of injury include:  · Direct trauma to the front of the knee.  · Repetitive and/ or stressful use of the knee.  RISK INCREASES WITH:  · Activities in which kneeling and/or falling on one's knees is likely (volleyball or football).  · Repetitive and stressful training, especially if it involves running on hills.  · Improper training techniques, such as a sudden increase in the intensity, frequency or duration of training.  · Failure to warm-up properly before activity.  · Poor technique.  · Artificial turf.  PREVENTION   · Avoid kneeling or falling on your knees.  · Warm up and stretch properly before activity.  · Allow for adequate recovery between workouts.  · Maintain physical fitness:  · Strength, flexibility, and endurance.  ·   Cardiovascular fitness.  · Learn and use proper technique. When possible, a have coach correct improper technique.  · Wear properly fitted and padded protective equipment (knee pads).  PROGNOSIS   If treated properly, then the symptoms of prepatellar bursitis usually resolve within 2 weeks.  RELATED COMPLICATIONS   · Recurrent symptoms that result in a chronic problem.  · Prolonged healing time, if improperly treated or re-injured.  · Limited range of motion.  · Infection of  bursa.  · Chronic inflammation or scarring of bursa.  TREATMENT   Treatment initially involves the use of ice and medication to help reduce pain and inflammation. The use of strengthening and stretching exercises may help reduce pain with activity, especially those of the quadriceps and hamstring muscles. These exercises may be performed at home or with referral to a therapist. Your caregiver may recommend kneepads when you return to playing sports, in order to reduce the stress on the prepatellar bursa. If symptoms persist despite treatment, then your caregiver may drain fluid out with a needle (aspirate) the bursa. If symptoms persist for greater than 6 months despite non-surgical (conservative) treatment, then surgery may be recommended to remove the bursa.   MEDICATION  · If pain medication is necessary, then nonsteroidal anti-inflammatory medications, such as aspirin and ibuprofen, or other minor pain relievers, such as acetaminophen, are often recommended.  · Do not take pain medication for 7 days before surgery.  · Prescription pain relievers may be given if deemed necessary by your caregiver. Use only as directed and only as much as you need.  · Corticosteroid injections may be given by your caregiver. These injections should be reserved for the most serious cases, because they may only be given a certain number of times.  HEAT AND COLD  · Cold treatment (icing) relieves pain and reduces inflammation. Cold treatment should be applied for 10 to 15 minutes every 2 to 3 hours for inflammation and pain and immediately after any activity that aggravates your symptoms. Use ice packs or massage the area with a piece of ice (ice massage).  · Heat treatment may be used prior to performing the stretching and strengthening activities prescribed by your caregiver, physical therapist, or athletic trainer. Use a heat pack or soak the injury in warm water.  SEEK MEDICAL CARE IF:  · Treatment seems to offer no benefit, or  the condition worsens.  · Any medications produce adverse side effects.  EXERCISES  RANGE OF MOTION (ROM) AND STRETCHING EXERCISES - Prepatellar Bursitis  These exercises may help you when beginning to rehabilitate your injury. Your symptoms may resolve with or without further involvement from your physician, physical therapist or athletic trainer. While completing these exercises, remember:   · Restoring tissue flexibility helps normal motion to return to the joints. This allows healthier, less painful movement and activity.  · An effective stretch should be held for at least 30 seconds.  · A stretch should never be painful. You should only feel a gentle lengthening or release in the stretched tissue.  STRETCH - Hamstrings, Standing  · Stand or sit and extend your right / left leg, placing your foot on a chair or foot stool  · Keeping a slight arch in your low back and your hips straight forward.  · Lead with your chest and lean forward at the waist until you feel a gentle stretch in the back of your right / left knee or thigh. (When done correctly, this exercise requires leaning only a   small distance.)  · Hold this position for __________ seconds.  Repeat __________ times. Complete this stretch __________ times per day.  STRETCH - Quadriceps, Prone   · Lie on your stomach on a firm surface, such as a bed or padded floor.  · Bend your right / left knee and grasp your ankle. If you are unable to reach, your ankle or pant leg, use a belt around your foot to lengthen your reach.  · Gently pull your heel toward your buttocks. Your knee should not slide out to the side. You should feel a stretch in the front of your thigh and/or knee.  · Hold this position for __________ seconds.  Repeat __________ times. Complete this stretch __________ times per day.   STRETCH - Hamstrings/Adductors, V-Sit   · Sit on the floor with your legs extended in a large "V," keeping your knees straight.  · With your head and chest upright,  bend at your waist reaching for your right foot to stretch your left adductors.  · You should feel a stretch in your left inner thigh. Hold for __________ seconds.  · Return to the upright position to relax your leg muscles.  · Continuing to keep your chest upright, bend straight forward at your waist to stretch your hamstrings.  · You should feel a stretch behind both of your thighs and/or knees. Hold for __________ seconds.  · Return to the upright position to relax your leg muscles.  · Repeat steps 2 through 4.  Repeat __________ times. Complete this exercise __________ times per day.   STRENGTHENING EXERCISES - Prepatellar Bursitis  · These exercises may help you when beginning to rehabilitate your injury. They may resolve your symptoms with or without further involvement from your physician, physical therapist or athletic trainer. While completing these exercises, remember:  · Muscles can gain both the endurance and the strength needed for everyday activities through controlled exercises.  · Complete these exercises as instructed by your physician, physical therapist or athletic trainer. Progress the resistance and repetitions only as guided.  STRENGTH - Quadriceps, Isometrics  · Lie on your back with your right / left leg extended and your opposite knee bent.  · Gradually tense the muscles in the front of your right / left thigh. You should see either your kneecap slide up toward your hip or increased dimpling just above the knee. This motion will push the back of the knee down toward the floor/mat/bed on which you are lying.  · Hold the muscle as tight as you can without increasing your pain for __________ seconds.  · Relax the muscles slowly and completely in between each repetition.  Repeat __________ times. Complete this exercise __________ times per day.   STRENGTH - Quadriceps, Short Arcs   · Lie on your back. Place a __________ inch towel roll under your knee so that the knee slightly bends.  · Raise  only your lower leg by tightening the muscles in the front of your thigh. Do not allow your thigh to rise.  · Hold this position for __________ seconds.  Repeat __________ times. Complete this exercise __________ times per day.   OPTIONAL ANKLE WEIGHTS: Begin with ____________________, but DO NOT exceed ____________________. Increase in1 lb/0.5 kg increments.   STRENGTH - Quadriceps, Straight Leg Raises   Quality counts! Watch for signs that the quadriceps muscle is working to insure you are strengthening the correct muscles and not "cheating" by substituting with healthier muscles.  · Lay on your   back with your right / left leg extended and your opposite knee bent.  · Tense the muscles in the front of your right / left thigh. You should see either your kneecap slide up or increased dimpling just above the knee. Your thigh may even quiver.  · Tighten these muscles even more and raise your leg 4 to 6 inches off the floor. Hold for __________ seconds.  · Keeping these muscles tense, lower your leg.  · Relax the muscles slowly and completely in between each repetition.  Repeat __________ times. Complete this exercise __________ times per day.   STRENGTH - Quadriceps, Step-Ups   · Use a thick book, step or step stool that is __________ inches tall.  · Holding a wall or counter for balance only, not support.  · Slowly step-up with your right / left foot, keeping your knee in line with your hip and foot. Do not allow your knee to bend so far that you cannot see your toes.  · Slowly unlock your knee and lower yourself to the starting position. Your muscles, not gravity, should lower you.  Repeat __________ times. Complete this exercise __________ times per day.  Document Released: 09/15/2005 Document Revised: 12/08/2011 Document Reviewed: 12/28/2008  ExitCare® Patient Information ©2013 [employer].

## 2012-09-23 ENCOUNTER — Telehealth: Payer: Self-pay | Admitting: Internal Medicine

## 2012-09-23 DIAGNOSIS — S8990XA Unspecified injury of unspecified lower leg, initial encounter: Secondary | ICD-10-CM

## 2012-09-23 NOTE — Telephone Encounter (Signed)
Patient Information:  Caller Name: Aracelis  Phone: [phone]  Patient: Erica Patterson, Erica Patterson  Gender: Female  DOB: 1941-01-02  Age: 71 Years  PCP: Oliver Barre (Adults only)  Office Follow Up:  Does the office need to follow up with this patient?: Yes  Instructions For The Office: REQUESTING KNEE XRAY  RN Note:  PATIENT IN THE OFFICE TWICE FOR KNEE EVALUATION. NO BETTER,  REQUESTING X-RAY OF KNEE AFTER FALL IN [place]. PLEASE CONTACT PATIENT.  Symptoms  Reason For Call & Symptoms: Patient states Left knee discomfort since September. Diagnosed with gout and was  treated and then fell on her Left  knee in November and had an injury. .  Continued to home treat. Returned to the office 09/14/12 and saw Nicki Reaper NP to reevaluate.  She received a shot of prednisone and taper.  She states on going discomfort. Intermittent swelling, no bruising.  Pain with any walking, bending..  She is requesting X-Ray now.  Reviewed Health History In EMR: Yes  Reviewed Medications In EMR: Yes  Reviewed Allergies In EMR: Yes  Reviewed Surgeries / Procedures: No  Date of Onset of Symptoms: 07/30/2012  Treatments Tried: Ice , stretching exercises, Taper prednisone, Aleve  Treatments Tried Worked: No  Guideline(s) Used:  Knee Pain  Knee Injury  Disposition Per Guideline:   See Today or Tomorrow in Office  Reason For Disposition Reached:   High-risk adult (e.g., age > 71, osteoporosis, chronic steroid use)  Advice Given:  Reassurance - Direct Blow (Contusion, Bruise)  A direct blow to your knee can cause a contusion. Contusion is the medical term for bruise.  Symptoms are mild pain, swelling, and/or bruising.  Here is some care advice that should help.  Rest vs. Movement:  Movement is generally more healing in the long term than rest.  Continue normal activities (like walking) as much as your pain permits.  Complete rest should only be used for the first day or two after an injury. If it really hurts too  much to walk, you will need to see the doctor.  Call Back If:  Pain becomes severe  Pain does not improve after 3 days  Pain or swelling lasts more than 2 weeks  You become worse.  Pain Medicines:  For pain relief, you can take either acetaminophen, ibuprofen, or naproxen.  They are over-the-counter (OTC) pain drugs. You can buy them at the drugstore.  Acetaminophen (e.g., Tylenol):  Regular Strength Tylenol: Take 650 mg (two 325 mg pills) by mouth every 4-6 hours as needed. Each Regular Strength Tylenol pill has 325 mg of acetaminophen.  Call Back If:  You have more questions  You become worse.  Patient Refused Recommendation:  Patient Refused Care Advice  PATIENT SEEN 09/14/12 FOR KNEE PAIN . TREATED TWICE NOT BETTER REQUESTING XRAY

## 2012-09-27 ENCOUNTER — Ambulatory Visit (INDEPENDENT_AMBULATORY_CARE_PROVIDER_SITE_OTHER)
Admission: RE | Admit: 2012-09-27 | Discharge: 2012-09-27 | Disposition: A | Discharge status: Home / Self Care | Payer: 59 | Source: Ambulatory Visit | Attending: Internal Medicine | Admitting: Internal Medicine

## 2012-09-27 DIAGNOSIS — S8990XA Unspecified injury of unspecified lower leg, initial encounter: Secondary | ICD-10-CM

## 2012-09-27 NOTE — Telephone Encounter (Signed)
Please advise in JWJ and RB's absence.

## 2012-09-27 NOTE — Telephone Encounter (Signed)
Pt informed xray order placed via VM and to callback office with any questions/concerns.

## 2012-09-27 NOTE — Telephone Encounter (Signed)
done

## 2012-10-01 ENCOUNTER — Ambulatory Visit (INDEPENDENT_AMBULATORY_CARE_PROVIDER_SITE_OTHER): Payer: 59 | Admitting: Internal Medicine

## 2012-10-01 ENCOUNTER — Encounter: Payer: Self-pay | Admitting: Internal Medicine

## 2012-10-01 VITALS — BP 132/70 | HR 78 | Temp 98.0°F | Ht 63.0 in | Wt 132.4 lb

## 2012-10-01 DIAGNOSIS — M25569 Pain in unspecified knee: Secondary | ICD-10-CM

## 2012-10-01 DIAGNOSIS — G8929 Other chronic pain: Secondary | ICD-10-CM | POA: Insufficient documentation

## 2012-10-01 DIAGNOSIS — M7040 Prepatellar bursitis, unspecified knee: Secondary | ICD-10-CM

## 2012-10-01 DIAGNOSIS — E785 Hyperlipidemia, unspecified: Secondary | ICD-10-CM

## 2012-10-01 DIAGNOSIS — M25562 Pain in left knee: Secondary | ICD-10-CM | POA: Insufficient documentation

## 2012-10-01 MED ORDER — TRAMADOL HCL 50 MG PO TABS: 50.0000 mg | ORAL_TABLET | Freq: Four times a day (QID) | ORAL | Status: DC | PRN

## 2012-10-01 NOTE — Patient Instructions (Addendum)
You appear to have signficant left knee medial joint line tenderness in addition to the prepatellar bursitis, which is consistent with a probable medial meniscal injury You will be contacted regarding the referral for: MRI for the left knee, and orthopedic referral Take all new medications as prescribed - tramadol for pain Continue all other medications as before Please continue your efforts at being more active, low cholesterol diet, and weight control. Thank you for enrolling in MyChart. Please follow the instructions below to securely access your online medical record. MyChart allows you to send messages to your doctor, view your test results, renew your prescriptions, schedule appointments, and more. To Log into MyChart, please go to [email or web address], and your Username is: [email or web address], password [email or web address]

## 2012-10-01 NOTE — Progress Notes (Signed)
Subjective:    Patient ID: Erica Patterson, female    DOB: [birth year], 72 y.o.   MRN: [id number]  HPI  Here after seen recently per NP, had been to [facility] [place] with walking and sightseeing for several days with gradual worsening of the left knee with swelling thought related to pre-patellar bursitis, tx with predpack, did not seem to help with overall pain and sweling persists, films dec 30 with patellofemoral changes/djd.  Tries to walk on regular basis but cant do so well now, plans to go to [place] Feb 1, needs to walk better.  Recent labs sept 2013 with normal esr, uric acid, RF and Anti-dna. Did fall approx 1 wk after seen here, films as above  Right knee no truama, wrists and hands hurt temp but resolved.  Ibuprofen helped at one time, alleve no help recently.  Pt denies chest pain, increased sob or doe, wheezing, orthopnea, PND, increased LE swelling, palpitations, dizziness or syncope.  Pt denies new neurological symptoms such as new headache, or facial or extremity weakness or numbness Past Medical History  Diagnosis Date  . CHICKENPOX, HX OF 06/12/2007  . HEPATITIS NOS 06/12/2007  . HYPERLIPIDEMIA 06/12/2007  . OSTEOARTHRITIS 06/12/2007  . OSTEOPENIA 05/24/2008  . SKIN LESION 07/03/2009  . UNSPECIFIED SUDDEN HEARING LOSS 07/03/2009   Past Surgical History  Procedure Date  . Abdominal hysterectomy   . Tonsillectomy   . Breast biopsy   . Oophorectomy     1 ovary    reports that she has never smoked. She does not have any smokeless tobacco history on file. She reports that she does not drink alcohol. Her drug history not on file. family history includes Coronary artery disease in her other; Diabetes in her other; and Hypertension in her other. Allergies  Allergen Reactions  . Alendronate Sodium    Current Outpatient Prescriptions on File Prior to Visit  Medication Sig Dispense Refill  . aspirin 81 MG tablet Take 81 mg by mouth daily.        . Calcium Carbonate-Vitamin D (CALCIUM + D PO)  Take by mouth 2 (two) times daily.         Review of Systems  Constitutional: Negative for diaphoresis and unexpected weight change.  HENT: Negative for tinnitus.   Eyes: Negative for photophobia and visual disturbance.  Respiratory: Negative for choking and stridor.   Gastrointestinal: Negative for vomiting and blood in stool.  Genitourinary: Negative for hematuria and decreased urine volume.   Skin: Negative for color change and wound.  Neurological: Negative for tremors and numbness.  Psychiatric/Behavioral: Negative for decreased concentration. The patient is not hyperactive.       Objective:   Physical Exam BP 132/70  Pulse 78  Temp 98 F (36.7 C) (Oral)  Ht 5\' 3"  (1.6 m)  Wt 132 lb 6 oz (60.045 kg)  BMI 23.45 kg/m2  SpO2 98% Physical Exam  VS noted, not ill appearing Constitutional: Pt appears well-developed and well-nourished.  HENT: Head: Normocephalic.  Right Ear: External ear normal.  Left Ear: External ear normal.  Eyes: Conjunctivae and EOM are normal. Pupils are equal, round, and reactive to light.  Neck: Normal range of motion. Neck supple.  Cardiovascular: Normal rate and regular rhythm.   Pulmonary/Chest: Effort normal and breath sounds normal.  Neurological: Pt is alert. Not confused , motor/dtr intact Skin: Skin is warm. No erythema.  Psychiatric: Pt behavior is normal. Thought content normal. Left knee:  Warm swollen small egg sized discrete area swelling  just lateral to lateral patellar tendon aspect, has neg drawer's test and lachman's, does have subtle right peripatellar Nonwarm swelling, and specific medial joint line tenderness, and + mcmurrays test with load to medial left knee     Assessment & Plan:

## 2012-10-02 ENCOUNTER — Encounter: Payer: Self-pay | Admitting: Internal Medicine

## 2012-10-02 NOTE — Assessment & Plan Note (Signed)
Also d/w pt, stable overall by hx and exam, most recent data reviewed with pt, and pt to continue medical treatment as before Lab Results  Component Value Date   CHOL 239* 06/25/2012   HDL 97.50 06/25/2012   LDLDIRECT 127.1 06/25/2012   TRIG 55.0 06/25/2012   CHOLHDL 2 06/25/2012

## 2012-10-02 NOTE — Assessment & Plan Note (Signed)
Mild persistent and tender on exam but this does not explain the severity of her pain

## 2012-10-02 NOTE — Assessment & Plan Note (Signed)
Given exam, I suspect medial meniscal injury, ACL testing negative, for MRI left knee, refer ortho but if signficant may not be improved prior to leaving for [place], for pain med prn

## 2012-10-11 ENCOUNTER — Other Ambulatory Visit: Payer: Self-pay | Admitting: Internal Medicine

## 2012-10-11 DIAGNOSIS — Z1231 Encounter for screening mammogram for malignant neoplasm of breast: Secondary | ICD-10-CM

## 2012-10-12 ENCOUNTER — Telehealth: Payer: Self-pay | Admitting: Internal Medicine

## 2012-10-12 NOTE — Telephone Encounter (Signed)
Forward  3 pages from [facility] to Dr. Oliver Barre for review on 10-12-12 ym

## 2012-11-11 ENCOUNTER — Ambulatory Visit: Payer: 59

## 2012-11-16 ENCOUNTER — Encounter: Payer: Self-pay | Admitting: Internal Medicine

## 2012-11-16 ENCOUNTER — Ambulatory Visit (INDEPENDENT_AMBULATORY_CARE_PROVIDER_SITE_OTHER): Payer: 59 | Admitting: Internal Medicine

## 2012-11-16 VITALS — BP 130/82 | HR 76 | Temp 97.0°F

## 2012-11-16 DIAGNOSIS — H6120 Impacted cerumen, unspecified ear: Secondary | ICD-10-CM

## 2012-11-16 DIAGNOSIS — H6590 Unspecified nonsuppurative otitis media, unspecified ear: Secondary | ICD-10-CM

## 2012-11-16 DIAGNOSIS — H6122 Impacted cerumen, left ear: Secondary | ICD-10-CM

## 2012-11-16 DIAGNOSIS — H6592 Unspecified nonsuppurative otitis media, left ear: Secondary | ICD-10-CM

## 2012-11-16 MED ORDER — AMOXICILLIN 500 MG PO CAPS: 500.0000 mg | ORAL_CAPSULE | Freq: Three times a day (TID) | ORAL | Status: DC

## 2012-11-16 NOTE — Progress Notes (Signed)
Subjective:    Patient ID: Erica Patterson, female    DOB: 14-Oct-1940, 72 y.o.   MRN: [id number]  HPI  Pt presents to the clinic today with c/o left ear fullness. This started 5 days ago when she was in [place]. She was in the shower, had to bend down to get the soap, and feels like she has water stuck in her ear. She denies pain, but does report a decrease in hearing in that ear. She has not had any drainage from it. She has had problems with this ear in the past due to an injury she sustained in her 70's. She has not put anything in her ear to try to get the water out.  Review of Systems      Past Medical History  Diagnosis Date  . CHICKENPOX, HX OF 06/12/2007  . HEPATITIS NOS 06/12/2007  . HYPERLIPIDEMIA 06/12/2007  . OSTEOARTHRITIS 06/12/2007  . OSTEOPENIA 05/24/2008  . SKIN LESION 07/03/2009  . UNSPECIFIED SUDDEN HEARING LOSS 07/03/2009    Current Outpatient Prescriptions  Medication Sig Dispense Refill  . aspirin 81 MG tablet Take 81 mg by mouth daily.        . Calcium Carbonate-Vitamin D (CALCIUM + D PO) Take by mouth 2 (two) times daily.        . traMADol (ULTRAM) 50 MG tablet Take 1 tablet (50 mg total) by mouth every 6 (six) hours as needed for pain.  120 tablet  1   No current facility-administered medications for this visit.    Allergies  Allergen Reactions  . Alendronate Sodium     Family History  Problem Relation Age of Onset  . Coronary artery disease Other     female 1st degree relative, female 1st degree relative  . Diabetes Other     1st degree relative  . Hypertension Other     History   Social History  . Marital Status: Widowed    Spouse Name: N/A    Number of Children: 3  . Years of Education: N/A   Occupational History  . 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    Social History Main Topics  . Smoking status: Never Smoker   . Smokeless tobacco: Not on file  . Alcohol Use: No  . Drug Use: Not on file  . Sexually Active: Not on file   Other Topics Concern   . Not on file   Social History Narrative  . No narrative on file     Constitutional: Denies fever, malaise, fatigue, headache or abrupt weight changes.  HEENT: Pt reports ear fullness. Denies eye pain, eye redness, ear pain, ringing in the ears, wax buildup, runny nose, nasal congestion, bloody nose, or sore throat. Neurological: Denies dizziness, difficulty with memory, difficulty with speech or problems with balance and coordination.   No other specific complaints in a complete review of systems (except as listed in HPI above).  Objective:   Physical Exam   BP 130/82  Pulse 76  Temp(Src) 97 F (36.1 C) (Oral)  SpO2 97% Wt Readings from Last 3 Encounters:  10/01/12 132 lb 6 oz (60.045 kg)  06/25/12 135 lb 8 oz (61.462 kg)  05/05/11 133 lb 6 oz (60.499 kg)    General: Appears her stated age, well developed, well nourished in NAD. HEENT: Head: normal shape and size; Eyes: sclera white, no icterus, conjunctiva pink, PERRLA and EOMs intact; Left Ear: Cerumen impaction, unable to visualize TM; Right Ear: Tm's gray and intact, normal light reflex; Nose:  mucosa pink and moist, septum midline; Throat/Mouth: Teeth present, mucosa pink and moist, no exudate, lesions or ulcerations noted.  Cardiovascular: Normal rate and rhythm. S1,S2 noted.  No murmur, rubs or gallops noted. No JVD or BLE edema. No carotid bruits noted. Pulmonary/Chest: Normal effort and positive vesicular breath sounds. No respiratory distress. No wheezes, rales or ronchi noted.  Neurological: Alert and oriented. Cranial nerves II-XII intact. Coordination normal. +DTRs bilaterally.       Assessment & Plan:   Cerumen Impaction, left ear, new onset with additional workup required:  Manual removal by CMA- after removed, TM injected, bulging  Pt instructed to avoid putting anything in her ears Encouraged to use OTC Debrox 2/week to avoid buildup of was in the future  Left Otitis Media, new onset with additional  workup required:  eRx for Amoxil TID x 10 days  RTC as needed or if symptoms persist

## 2012-11-16 NOTE — Patient Instructions (Addendum)
Cerumen Impaction The structures of the external ear canal secrete a waxy substance known as cerumen. Excess cerumen can build up in the ear canal, causing a condition known as cerumen impaction. Cerumen impaction can cause ear pain as well as disrupt the function of the ear. The rate of cerumen production differs for each individual. For certain individuals, the configuration of one's ear canal may cause him or her to have a decreased ability to naturally remove cerumen. It is important to note that removing cerumen as a part of normal hygiene is not necessary, and the use of swabs in the ear canal is not recommended. SYMPTOMS   Diminished hearing.  Ear drainage.  Ear pain.  Ear itch. CAUSES  Excessive cerumen production.  RISK INCREASES WITH:  Frequent use of swabs to clean ears.  Narrow ear canals.  Eczema (a skin condition).  Dehydration. PREVENTION  Do Not insert objects into the ear, even with the intent of cleaning the ear.  Maintain hydration.  Control eczema if present. TREATMENT  Maintaining preventative measures is the best way to treat cerumen impaction. If symptoms of cerumen impaction develop the first step is to use over-the-counter or prescription ear drops that are intended to soften the cerumen. If the cerumen does not clear, then visit your caregiver to have the cerumen removed. The most common method for cerumen removal is through irrigation with warm water. Although, some caregivers use ear curettes and other instruments to remove the cerumen physically. In the most severe cases, cerumen may be removed surgically. Document Released: 09/15/2005 Document Revised: 12/08/2011 Document Reviewed: 12/28/2008 [facility] Patient Information 2013 [place], [place]. Otitis Media, Adult A middle ear infection is an infection in the space behind the eardrum. The medical name for this is "otitis media." It may happen after a common cold. It is caused by a germ that starts  growing in that space. You may feel swollen glands in your neck on the side of the ear infection. HOME CARE INSTRUCTIONS   Take your medicine as directed until it is gone, even if you feel better after the first few days.  Only take over-the-counter or prescription medicines for pain, discomfort, or fever as directed by your caregiver.  Occasional use of a nasal decongestant a couple times per day may help with discomfort and help the eustachian tube to drain better. Follow up with your caregiver in 10 to 14 days or as directed, to be certain that the infection has cleared. Not keeping the appointment could result in a chronic or permanent injury, pain, hearing loss and disability. If there is any problem keeping the appointment, you must call back to this facility for assistance. SEEK IMMEDIATE MEDICAL CARE IF:   You are not getting better in 2 to 3 days.  You have pain that is not controlled with medication.  You feel worse instead of better.  You cannot use the medication as directed.  You develop swelling, redness or pain around the ear or stiffness in your neck. MAKE SURE YOU:   Understand these instructions.  Will watch your condition.  Will get help right away if you are not doing well or get worse. Document Released: 06/20/2004 Document Revised: 12/08/2011 Document Reviewed: 04/21/2008 [facility] Patient Information 2013 [place], [place].

## 2012-11-18 ENCOUNTER — Ambulatory Visit
Admission: RE | Admit: 2012-11-18 | Discharge: 2012-11-18 | Disposition: A | Discharge status: Home / Self Care | Payer: Medicare HMO | Source: Ambulatory Visit | Attending: Internal Medicine | Admitting: Internal Medicine

## 2013-06-30 ENCOUNTER — Encounter: Payer: Self-pay | Admitting: Internal Medicine

## 2013-06-30 ENCOUNTER — Ambulatory Visit (INDEPENDENT_AMBULATORY_CARE_PROVIDER_SITE_OTHER): Payer: Medicare HMO | Admitting: Internal Medicine

## 2013-06-30 ENCOUNTER — Ambulatory Visit (INDEPENDENT_AMBULATORY_CARE_PROVIDER_SITE_OTHER): Payer: Medicare HMO

## 2013-06-30 VITALS — BP 110/70 | HR 74 | Temp 97.0°F | Ht 62.0 in | Wt 132.4 lb

## 2013-06-30 DIAGNOSIS — Z0000 Encounter for general adult medical examination without abnormal findings: Secondary | ICD-10-CM

## 2013-06-30 DIAGNOSIS — Z23 Encounter for immunization: Secondary | ICD-10-CM

## 2013-06-30 DIAGNOSIS — Z136 Encounter for screening for cardiovascular disorders: Secondary | ICD-10-CM

## 2013-06-30 DIAGNOSIS — M159 Polyosteoarthritis, unspecified: Secondary | ICD-10-CM

## 2013-06-30 LAB — CBC WITH DIFFERENTIAL/PLATELET
Basophils Relative: 0.6 % (ref 0.0–3.0)
Eosinophils Relative: 0.2 % (ref 0.0–5.0)
HCT: 41.1 % (ref 36.0–46.0)
Lymphs Abs: 1.7 10*3/uL (ref 0.7–4.0)
MCV: 89.8 fl (ref 78.0–100.0)
Monocytes Absolute: 0.5 10*3/uL (ref 0.1–1.0)
RBC: 4.58 Mil/uL (ref 3.87–5.11)
WBC: 6.2 10*3/uL (ref 4.5–10.5)

## 2013-06-30 LAB — URINALYSIS, ROUTINE W REFLEX MICROSCOPIC
Bilirubin Urine: NEGATIVE
Ketones, ur: 15
Nitrite: NEGATIVE
Total Protein, Urine: NEGATIVE
pH: 6 (ref 5.0–8.0)

## 2013-06-30 NOTE — Patient Instructions (Addendum)
You had the flu shot today, and the Prevnar pneumonia shot Please continue all other medications as before, and refills have been done if requested. Please call if you change your mind about the celebrex Please continue your efforts at being more active, low cholesterol diet, and weight control. You are otherwise up to date with prevention measures today. Please go to the LAB in the Basement (turn left off the elevator) for the tests to be done today You will be contacted by phone if any changes need to be made immediately.  Otherwise, you will receive a letter about your results with an explanation, but please check with MyChart first.  Please remember to sign up for My Chart if you have not done so, as this will be important to you in the future with finding out test results, communicating by private email, and scheduling acute appointments online when needed.  Please return in 1 year for your yearly visit, or sooner if needed

## 2013-06-30 NOTE — Assessment & Plan Note (Signed)
To cont alleve prn, consider celebrex 100 bid prn if worsening

## 2013-06-30 NOTE — Assessment & Plan Note (Signed)

## 2013-06-30 NOTE — Progress Notes (Signed)
Subjective:    Patient ID: Erica Patterson, female    DOB: March 19, 1941, 72 y.o.   MRN: [id number]  HPI  Here for wellness and f/u;  Overall doing ok;  Pt denies CP, worsening SOB, DOE, wheezing, orthopnea, PND, worsening LE edema, palpitations, dizziness or syncope.  Pt denies neurological change such as new headache, facial or extremity weakness.  Pt denies polydipsia, polyuria, or low sugar symptoms. Pt states overall good compliance with treatment and medications, good tolerability, and has been trying to follow lower cholesterol diet.  Pt denies worsening depressive symptoms, suicidal ideation or panic. No fever, night sweats, wt loss, loss of appetite, or other constitutional symptoms.  Pt states good ability with ADL's, has low fall risk, home safety reviewed and adequate, no other significant changes in hearing or vision, and only occasionally active with exercise.  Did have onset vertigo, started in Aug, better now except with large postural changes such as with lying down for the ECG in the office today.  Has been tyring to follow lower chol diet.  Has had several arthritic joints pain, worse at the MTP's, knees , right shoulder and left knee (s/p arthroscopic surgury mar 2014) and right knee.  Past Medical History  Diagnosis Date  . CHICKENPOX, HX OF 06/12/2007  . HEPATITIS NOS 06/12/2007  . HYPERLIPIDEMIA 06/12/2007  . OSTEOARTHRITIS 06/12/2007  . OSTEOPENIA 05/24/2008  . SKIN LESION 07/03/2009  . UNSPECIFIED SUDDEN HEARING LOSS 07/03/2009   Past Surgical History  Procedure Laterality Date  . Abdominal hysterectomy    . Tonsillectomy    . Breast biopsy    . Oophorectomy      1 ovary    reports that she has never smoked. She does not have any smokeless tobacco history on file. She reports that she does not drink alcohol. Her drug history is not on file. family history includes Coronary artery disease in her other; Diabetes in her other; Hypertension in her other. Allergies  Allergen  Reactions  . Alendronate Sodium    Current Outpatient Prescriptions on File Prior to Visit  Medication Sig Dispense Refill  . aspirin 81 MG tablet Take 81 mg by mouth daily.        . Calcium Carbonate-Vitamin D (CALCIUM + D PO) Take by mouth 2 (two) times daily.         No current facility-administered medications on file prior to visit.   Review of Systems Constitutional: Negative for diaphoresis, activity change, appetite change or unexpected weight change.  HENT: Negative for hearing loss, ear pain, facial swelling, mouth sores and neck stiffness.   Eyes: Negative for pain, redness and visual disturbance.  Respiratory: Negative for shortness of breath and wheezing.   Cardiovascular: Negative for chest pain and palpitations.  Gastrointestinal: Negative for diarrhea, blood in stool, abdominal distention or other pain Genitourinary: Negative for hematuria, flank pain or change in urine volume.  Musculoskeletal: Negative for myalgias and joint swelling.  Skin: Negative for color change and wound.  Neurological: Negative for syncope and numbness. other than noted Hematological: Negative for adenopathy.  Psychiatric/Behavioral: Negative for hallucinations, self-injury, decreased concentration and agitation.      Objective:   Physical Exam BP 110/70  Pulse 74  Temp(Src) 97 F (36.1 C) (Oral)  Ht 5\' 2"  (1.575 m)  Wt 132 lb 6 oz (60.045 kg)  BMI 24.21 kg/m2  SpO2 95% VS noted,  Constitutional: Pt is oriented to person, place, and time. Appears well-developed and well-nourished.  Head: Normocephalic and atraumatic.  Right Ear: External ear normal.  Left Ear: External ear normal.  Nose: Nose normal.  Mouth/Throat: Oropharynx is clear and moist.  Eyes: Conjunctivae and EOM are normal. Pupils are equal, round, and reactive to light.  Neck: Normal range of motion. Neck supple. No JVD present. No tracheal deviation present.  Cardiovascular: Normal rate, regular rhythm, normal heart  sounds and intact distal pulses.   Pulmonary/Chest: Effort normal and breath sounds normal.  Abdominal: Soft. Bowel sounds are normal. There is no tenderness. No HSM  Musculoskeletal: Normal range of motion. Exhibits no edema.  Lymphadenopathy:  Has no cervical adenopathy.  Neurological: Pt is alert and oriented to person, place, and time. Pt has normal reflexes. No cranial nerve deficit.  Skin: Skin is warm and dry. No rash noted.  Psychiatric:  Has  normal mood and affect. Behavior is normal.     Assessment & Plan:

## 2013-06-30 NOTE — Addendum Note (Signed)
Addended by: Scharlene Gloss B on: 06/30/2013 10:23 AM   Modules accepted: Orders

## 2013-07-01 LAB — BASIC METABOLIC PANEL
BUN: 10 mg/dL (ref 6–23)
Creatinine, Ser: 0.7 mg/dL (ref 0.4–1.2)
GFR: 87.27 mL/min (ref >60.00)
Potassium: 4.1 mEq/L (ref 3.5–5.1)

## 2013-07-01 LAB — LIPID PANEL
Cholesterol: 214 mg/dL — ABNORMAL HIGH (ref 0–200)
HDL: 94.1 mg/dL (ref >39.00)
VLDL: 10.8 mg/dL (ref 0.0–40.0)

## 2013-07-01 LAB — LDL CHOLESTEROL, DIRECT: Direct LDL: 110.2 mg/dL

## 2013-07-01 LAB — HEPATIC FUNCTION PANEL: Total Bilirubin: 0.8 mg/dL (ref 0.3–1.2)

## 2013-10-19 ENCOUNTER — Other Ambulatory Visit: Payer: Self-pay

## 2013-10-19 DIAGNOSIS — Z1231 Encounter for screening mammogram for malignant neoplasm of breast: Secondary | ICD-10-CM

## 2013-11-21 ENCOUNTER — Ambulatory Visit
Admission: RE | Admit: 2013-11-21 | Discharge: 2013-11-21 | Disposition: A | Discharge status: Home / Self Care | Payer: Medicare HMO | Source: Ambulatory Visit

## 2013-11-21 DIAGNOSIS — Z1231 Encounter for screening mammogram for malignant neoplasm of breast: Secondary | ICD-10-CM

## 2013-11-21 LAB — HM MAMMOGRAPHY

## 2014-07-06 ENCOUNTER — Encounter: Payer: Self-pay | Admitting: Internal Medicine

## 2014-07-06 ENCOUNTER — Other Ambulatory Visit (INDEPENDENT_AMBULATORY_CARE_PROVIDER_SITE_OTHER): Payer: Medicare HMO

## 2014-07-06 ENCOUNTER — Ambulatory Visit (INDEPENDENT_AMBULATORY_CARE_PROVIDER_SITE_OTHER): Payer: Medicare HMO | Admitting: Internal Medicine

## 2014-07-06 VITALS — BP 102/70 | HR 88 | Temp 97.7°F | Ht 63.0 in | Wt 123.5 lb

## 2014-07-06 DIAGNOSIS — Z0189 Encounter for other specified special examinations: Secondary | ICD-10-CM

## 2014-07-06 DIAGNOSIS — Z0000 Encounter for general adult medical examination without abnormal findings: Secondary | ICD-10-CM

## 2014-07-06 DIAGNOSIS — E785 Hyperlipidemia, unspecified: Secondary | ICD-10-CM

## 2014-07-06 DIAGNOSIS — Z23 Encounter for immunization: Secondary | ICD-10-CM

## 2014-07-06 LAB — CBC WITH DIFFERENTIAL/PLATELET
BASOS ABS: 0 10*3/uL (ref 0.0–0.1)
Basophils Relative: 0.5 % (ref 0.0–3.0)
EOS PCT: 0.6 % (ref 0.0–5.0)
Eosinophils Absolute: 0 10*3/uL (ref 0.0–0.7)
HCT: 42.9 % (ref 36.0–46.0)
Hemoglobin: 14.1 g/dL (ref 12.0–15.0)
LYMPHS PCT: 33.4 % (ref 12.0–46.0)
Lymphs Abs: 2.4 10*3/uL (ref 0.7–4.0)
MCHC: 32.9 g/dL (ref 30.0–36.0)
MCV: 91.9 fl (ref 78.0–100.0)
MONOS PCT: 10.2 % (ref 3.0–12.0)
Monocytes Absolute: 0.7 10*3/uL (ref 0.1–1.0)
NEUTROS PCT: 55.3 % (ref 43.0–77.0)
Neutro Abs: 3.9 10*3/uL (ref 1.4–7.7)
PLATELETS: 307 10*3/uL (ref 150.0–400.0)
RBC: 4.67 Mil/uL (ref 3.87–5.11)
RDW: 13.3 % (ref 11.5–15.5)
WBC: 7.1 10*3/uL (ref 4.0–10.5)

## 2014-07-06 LAB — URINALYSIS, ROUTINE W REFLEX MICROSCOPIC
Bilirubin Urine: NEGATIVE
KETONES UR: 15 — AB
NITRITE: NEGATIVE
SPECIFIC GRAVITY, URINE: 1.015 (ref 1.000–1.030)
Total Protein, Urine: NEGATIVE
UROBILINOGEN UA: 0.2 (ref 0.0–1.0)
Urine Glucose: NEGATIVE
pH: 5.5 (ref 5.0–8.0)

## 2014-07-06 LAB — HEPATIC FUNCTION PANEL
ALBUMIN: 3.8 g/dL (ref 3.5–5.2)
ALK PHOS: 56 U/L (ref 39–117)
ALT: 21 U/L (ref 0–35)
AST: 21 U/L (ref 0–37)
Bilirubin, Direct: 0.1 mg/dL (ref 0.0–0.3)
TOTAL PROTEIN: 7.8 g/dL (ref 6.0–8.3)
Total Bilirubin: 0.7 mg/dL (ref 0.2–1.2)

## 2014-07-06 LAB — BASIC METABOLIC PANEL
BUN: 10 mg/dL (ref 6–23)
CO2: 28 meq/L (ref 19–32)
Calcium: 9.4 mg/dL (ref 8.4–10.5)
Chloride: 102 mEq/L (ref 96–112)
Creatinine, Ser: 0.7 mg/dL (ref 0.4–1.2)
GFR: 87.03 mL/min (ref >60.00)
GLUCOSE: 93 mg/dL (ref 70–99)
POTASSIUM: 4 meq/L (ref 3.5–5.1)
SODIUM: 138 meq/L (ref 135–145)

## 2014-07-06 LAB — LIPID PANEL
CHOLESTEROL: 190 mg/dL (ref 0–200)
HDL: 71.5 mg/dL (ref >39.00)
LDL Cholesterol: 107 mg/dL — ABNORMAL HIGH (ref 0–99)
NonHDL: 118.5
TRIGLYCERIDES: 57 mg/dL (ref 0.0–149.0)
Total CHOL/HDL Ratio: 3
VLDL: 11.4 mg/dL (ref 0.0–40.0)

## 2014-07-06 LAB — TSH: TSH: 1.18 u[IU]/mL (ref 0.35–4.50)

## 2014-07-06 NOTE — Progress Notes (Signed)
Pre visit review using our clinic review tool, if applicable. No additional management support is needed unless otherwise documented below in the visit note. 

## 2014-07-06 NOTE — Progress Notes (Signed)
Subjective:    Patient ID: Beverly MilchRita T Buccheri, female    DOB: [birth year], 73 y.o.   MRN: [id number]  HPI  Here for wellness and f/u;  Overall doing ok;  Pt denies CP, worsening SOB, DOE, wheezing, orthopnea, PND, worsening LE edema, palpitations, dizziness or syncope.  Pt denies neurological change such as new headache, facial or extremity weakness.  Pt denies polydipsia, polyuria, or low sugar symptoms. Pt states overall good compliance with treatment and medications, good tolerability, and has been trying to follow lower cholesterol diet.  Pt denies worsening depressive symptoms, suicidal ideation or panic. No fever, night sweats, wt loss, loss of appetite, or other constitutional symptoms.  Pt states good ability with ADL's, has low fall risk, home safety reviewed and adequate, no other significant changes in hearing or vision, and only occasionally active with exercise.  Just back from 3 wks pleasure trip to [place].  No current complaints Past Medical History  Diagnosis Date  . CHICKENPOX, HX OF 06/12/2007  . HEPATITIS NOS 06/12/2007  . HYPERLIPIDEMIA 06/12/2007  . OSTEOARTHRITIS 06/12/2007  . OSTEOPENIA 05/24/2008  . SKIN LESION 07/03/2009  . UNSPECIFIED SUDDEN HEARING LOSS 07/03/2009   Past Surgical History  Procedure Laterality Date  . Abdominal hysterectomy    . Tonsillectomy    . Breast biopsy    . Oophorectomy      1 ovary    reports that she has never smoked. She does not have any smokeless tobacco history on file. She reports that she does not drink alcohol. Her drug history is not on file. family history includes Coronary artery disease in her other; Diabetes in her other; Hypertension in her other. Allergies  Allergen Reactions  . Alendronate Sodium    Current Outpatient Prescriptions on File Prior to Visit  Medication Sig Dispense Refill  . aspirin 81 MG tablet Take 81 mg by mouth daily.        . Calcium Carbonate-Vitamin D (CALCIUM + D PO) Take by mouth 2 (two) times daily.          No current facility-administered medications on file prior to visit.   Review of Systems Constitutional: Negative for increased diaphoresis, other activity, appetite or other siginficant weight change  HENT: Negative for worsening hearing loss, ear pain, facial swelling, mouth sores and neck stiffness.   Eyes: Negative for other worsening pain, redness or visual disturbance.  Respiratory: Negative for shortness of breath and wheezing.   Cardiovascular: Negative for chest pain and palpitations.  Gastrointestinal: Negative for diarrhea, blood in stool, abdominal distention or other pain Genitourinary: Negative for hematuria, flank pain or change in urine volume.  Musculoskeletal: Negative for myalgias or other joint complaints.  Skin: Negative for color change and wound.  Neurological: Negative for syncope and numbness. other than noted Hematological: Negative for adenopathy. or other swelling Psychiatric/Behavioral: Negative for hallucinations, self-injury, decreased concentration or other worsening agitation.      Objective:   Physical Exam BP 102/70  Pulse 88  Temp(Src) 97.7 F (36.5 C) (Oral)  Ht 5\' 3"  (1.6 m)  Wt 123 lb 8 oz (56.019 kg)  BMI 21.88 kg/m2  SpO2 97% VS noted,  Constitutional: Pt is oriented to person, place, and time. Appears well-developed and well-nourished.  Head: Normocephalic and atraumatic.  Right Ear: External ear normal.  Left Ear: External ear normal.  Nose: Nose normal.  Mouth/Throat: Oropharynx is clear and moist.  Eyes: Conjunctivae and EOM are normal. Pupils are equal, round, and reactive to light.  Neck: Normal range of motion. Neck supple. No JVD present. No tracheal deviation present.  Cardiovascular: Normal rate, regular rhythm, normal heart sounds and intact distal pulses.   Pulmonary/Chest: Effort normal and breath sounds without rales or wheezing  Abdominal: Soft. Bowel sounds are normal. NT. No HSM  Musculoskeletal: Normal range of  motion. Exhibits no edema.  Lymphadenopathy:  Has no cervical adenopathy.  Neurological: Pt is alert and oriented to person, place, and time. Pt has normal reflexes. No cranial nerve deficit. Motor grossly intact Skin: Skin is warm and dry. No rash noted.  Psychiatric:  Has normal mood and affect. Behavior is normal.     Assessment & Plan:

## 2014-07-06 NOTE — Assessment & Plan Note (Signed)

## 2014-07-06 NOTE — Patient Instructions (Addendum)
Please continue all other medications as before, and refills have been done if requested.  Please have the pharmacy call with any other refills you may need.  Please continue your efforts at being more active, low cholesterol diet, and weight control.  You are otherwise up to date with prevention measures today.  Please keep your appointments with your specialists as you may have planned  Please go to the LAB in the Basement (turn left off the elevator) for the tests to be done today  You will be contacted by phone if any changes need to be made immediately.  Otherwise, you will receive a letter about your results with an explanation, but please check with MyChart first.  Thank you for enrolling in MyChart. Please follow the instructions below to securely access your online medical record. MyChart allows you to send messages to your doctor, view your test results, renew your prescriptions, schedule appointments, and more.   To Log into MyChart, please go to [email or web address], and your Username is: [email or web address], password [email or web address]  Please return in 1 year for your yearly visit, or sooner if needed, with Lab testing done 3-5 days before

## 2014-10-19 ENCOUNTER — Other Ambulatory Visit: Payer: Self-pay

## 2014-10-19 DIAGNOSIS — Z1231 Encounter for screening mammogram for malignant neoplasm of breast: Secondary | ICD-10-CM

## 2014-11-22 ENCOUNTER — Ambulatory Visit: Payer: Medicare HMO

## 2014-11-27 ENCOUNTER — Ambulatory Visit
Admission: RE | Admit: 2014-11-27 | Discharge: 2014-11-27 | Disposition: A | Discharge status: Home / Self Care | Payer: Medicare HMO | Source: Ambulatory Visit

## 2014-11-27 ENCOUNTER — Other Ambulatory Visit: Payer: Self-pay

## 2014-11-27 DIAGNOSIS — Z1231 Encounter for screening mammogram for malignant neoplasm of breast: Secondary | ICD-10-CM

## 2014-12-26 ENCOUNTER — Telehealth: Payer: Self-pay | Admitting: Internal Medicine

## 2014-12-26 NOTE — Telephone Encounter (Signed)
Sorry, I would not know any information about DNA testing  And I believe genetic counseling as well is not available in [place] if that is what she really means

## 2014-12-26 NOTE — Telephone Encounter (Signed)
Patient mentioned to Dr. Jonny RuizJohn about maybe getting some DNA testing and what their phone number or address is. Please advise patient

## 2014-12-28 NOTE — Telephone Encounter (Signed)
Pt. Aware.

## 2015-04-23 DIAGNOSIS — H5340 Unspecified visual field defects: Secondary | ICD-10-CM | POA: Diagnosis not present

## 2015-04-23 DIAGNOSIS — H3531 Nonexudative age-related macular degeneration: Secondary | ICD-10-CM | POA: Diagnosis not present

## 2015-04-23 DIAGNOSIS — H52203 Unspecified astigmatism, bilateral: Secondary | ICD-10-CM | POA: Diagnosis not present

## 2015-04-23 DIAGNOSIS — H26103 Unspecified traumatic cataract, bilateral: Secondary | ICD-10-CM | POA: Diagnosis not present

## 2015-07-03 ENCOUNTER — Other Ambulatory Visit (INDEPENDENT_AMBULATORY_CARE_PROVIDER_SITE_OTHER): Payer: Medicare PPO

## 2015-07-03 DIAGNOSIS — Z0000 Encounter for general adult medical examination without abnormal findings: Secondary | ICD-10-CM

## 2015-07-03 DIAGNOSIS — E785 Hyperlipidemia, unspecified: Secondary | ICD-10-CM

## 2015-07-03 DIAGNOSIS — Z0189 Encounter for other specified special examinations: Secondary | ICD-10-CM

## 2015-07-03 LAB — CBC WITH DIFFERENTIAL/PLATELET
BASOS ABS: 0 10*3/uL (ref 0.0–0.1)
BASOS PCT: 0.5 % (ref 0.0–3.0)
Eosinophils Absolute: 0 10*3/uL (ref 0.0–0.7)
Eosinophils Relative: 0.4 % (ref 0.0–5.0)
HEMATOCRIT: 42.2 % (ref 36.0–46.0)
HEMOGLOBIN: 14.4 g/dL (ref 12.0–15.0)
LYMPHS PCT: 29.9 % (ref 12.0–46.0)
Lymphs Abs: 1.9 10*3/uL (ref 0.7–4.0)
MCHC: 34.2 g/dL (ref 30.0–36.0)
MCV: 91.1 fl (ref 78.0–100.0)
MONOS PCT: 9.2 % (ref 3.0–12.0)
Monocytes Absolute: 0.6 10*3/uL (ref 0.1–1.0)
NEUTROS ABS: 3.8 10*3/uL (ref 1.4–7.7)
Neutrophils Relative %: 60 % (ref 43.0–77.0)
PLATELETS: 225 10*3/uL (ref 150.0–400.0)
RBC: 4.63 Mil/uL (ref 3.87–5.11)
RDW: 13 % (ref 11.5–15.5)
WBC: 6.3 10*3/uL (ref 4.0–10.5)

## 2015-07-03 LAB — URINALYSIS, ROUTINE W REFLEX MICROSCOPIC
Bilirubin Urine: NEGATIVE
KETONES UR: 40 — AB
LEUKOCYTES UA: NEGATIVE
Nitrite: NEGATIVE
SPECIFIC GRAVITY, URINE: 1.025 (ref 1.000–1.030)
Total Protein, Urine: NEGATIVE
URINE GLUCOSE: NEGATIVE
UROBILINOGEN UA: 0.2 (ref 0.0–1.0)
pH: 5.5 (ref 5.0–8.0)

## 2015-07-03 LAB — LIPID PANEL
CHOL/HDL RATIO: 2
Cholesterol: 218 mg/dL — ABNORMAL HIGH (ref 0–200)
HDL: 100.3 mg/dL (ref >39.00)
LDL Cholesterol: 107 mg/dL — ABNORMAL HIGH (ref 0–99)
NONHDL: 117.62
Triglycerides: 51 mg/dL (ref 0.0–149.0)
VLDL: 10.2 mg/dL (ref 0.0–40.0)

## 2015-07-03 LAB — HEPATIC FUNCTION PANEL
ALT: 13 U/L (ref 0–35)
AST: 18 U/L (ref 0–37)
Albumin: 4.4 g/dL (ref 3.5–5.2)
Alkaline Phosphatase: 54 U/L (ref 39–117)
BILIRUBIN DIRECT: 0.1 mg/dL (ref 0.0–0.3)
BILIRUBIN TOTAL: 0.6 mg/dL (ref 0.2–1.2)
TOTAL PROTEIN: 7.4 g/dL (ref 6.0–8.3)

## 2015-07-03 LAB — BASIC METABOLIC PANEL
BUN: 14 mg/dL (ref 6–23)
CALCIUM: 9.3 mg/dL (ref 8.4–10.5)
CO2: 25 mEq/L (ref 19–32)
CREATININE: 0.71 mg/dL (ref 0.40–1.20)
Chloride: 103 mEq/L (ref 96–112)
GFR: 85.38 mL/min (ref >60.00)
Glucose, Bld: 85 mg/dL (ref 70–99)
Potassium: 4 mEq/L (ref 3.5–5.1)
Sodium: 139 mEq/L (ref 135–145)

## 2015-07-03 LAB — TSH: TSH: 0.99 u[IU]/mL (ref 0.35–4.50)

## 2015-07-12 ENCOUNTER — Ambulatory Visit (INDEPENDENT_AMBULATORY_CARE_PROVIDER_SITE_OTHER): Payer: Medicare PPO | Admitting: Internal Medicine

## 2015-07-12 ENCOUNTER — Encounter: Payer: Self-pay | Admitting: Internal Medicine

## 2015-07-12 VITALS — BP 108/58 | HR 81 | Temp 97.6°F | Ht 63.0 in | Wt 118.0 lb

## 2015-07-12 DIAGNOSIS — E785 Hyperlipidemia, unspecified: Secondary | ICD-10-CM | POA: Diagnosis not present

## 2015-07-12 DIAGNOSIS — Z0000 Encounter for general adult medical examination without abnormal findings: Secondary | ICD-10-CM | POA: Diagnosis not present

## 2015-07-12 DIAGNOSIS — Z23 Encounter for immunization: Secondary | ICD-10-CM

## 2015-07-12 NOTE — Patient Instructions (Addendum)
You had the flu shot today  You will be contacted regarding the referral for: cologuard  Please call if you change your mind about the bon density, after you look into the Prolia.   Your EKG was OK today, as well as the blood work  Please continue all other medications as before, and refills have been done if requested.  Please have the pharmacy call with any other refills you may need.  Please continue your efforts at being more active, low cholesterol diet, and weight control.  You are otherwise up to date with prevention measures today.  Please keep your appointments with your specialists as you may have planned  Please return in 1 year for your yearly visit, or sooner if needed, with Lab testing done 3-5 days before

## 2015-07-12 NOTE — Addendum Note (Signed)
Addended by: Corwin LevinsJOHN, JAMES W on: 07/12/2015 04:18 PM   Modules accepted: [employer]

## 2015-07-12 NOTE — Progress Notes (Signed)
Subjective:    Patient ID: Erica Patterson, female    DOB: [birth year], 74 y.o.   MRN: [id number]  HPI  Here for wellness and f/u;  Overall doing ok;  Pt denies Chest pain, worsening SOB, DOE, wheezing, orthopnea, PND, worsening LE edema, palpitations, dizziness or syncope.  Pt denies neurological change such as new headache, facial or extremity weakness.  Pt denies polydipsia, polyuria, or low sugar symptoms. Pt states overall good compliance with treatment and medications, good tolerability, and has been trying to follow appropriate diet.  Pt denies worsening depressive symptoms, suicidal ideation or panic. No fever, night sweats, wt loss, loss of appetite, or other constitutional symptoms.  Pt states good ability with ADL's, has low fall risk, home safety reviewed and adequate, no other significant changes in hearing or vision, and only occasionally active with exercise.  Due for DXA, but does not want fosamax type meds, might consider prolia,  But wants to further research. Asks for colonoscopy, but would like cologuard instead for now.  Aug 25, 2014 endorses a flu like illness serious to her with n/v/d but did have to go to ER, despite the flu shot that season.   Past Medical History  Diagnosis Date  . CHICKENPOX, HX OF 06/12/2007  . HEPATITIS NOS 06/12/2007  . HYPERLIPIDEMIA 06/12/2007  . OSTEOARTHRITIS 06/12/2007  . OSTEOPENIA 05/24/2008  . SKIN LESION 07/03/2009  . UNSPECIFIED SUDDEN HEARING LOSS 07/03/2009   Past Surgical History  Procedure Laterality Date  . Abdominal hysterectomy    . Tonsillectomy    . Breast biopsy    . Oophorectomy      1 ovary    reports that she has never smoked. She does not have any smokeless tobacco history on file. She reports that she does not drink alcohol. Her drug history is not on file. family history includes Coronary artery disease in her other; Diabetes in her other; Hypertension in her other. Allergies  Allergen Reactions  . Alendronate Sodium     Current Outpatient Prescriptions on File Prior to Visit  Medication Sig Dispense Refill  . aspirin 81 MG tablet Take 81 mg by mouth daily.      . beta carotene w/minerals (OCUVITE) tablet Take 1 tablet by mouth daily.    . Calcium Carbonate-Vitamin D (CALCIUM + D PO) Take by mouth 2 (two) times daily.       No current facility-administered medications on file prior to visit.    Review of Systems Constitutional: Negative for increased diaphoresis, other activity, appetite or siginficant weight change other than noted HENT: Negative for worsening hearing loss, ear pain, facial swelling, mouth sores and neck stiffness.   Eyes: Negative for other worsening pain, redness or visual disturbance.  Respiratory: Negative for shortness of breath and wheezing  Cardiovascular: Negative for chest pain and palpitations.  Gastrointestinal: Negative for diarrhea, blood in stool, abdominal distention or other pain Genitourinary: Negative for hematuria, flank pain or change in urine volume.  Musculoskeletal: Negative for myalgias or other joint complaints.  Skin: Negative for color change and wound or drainage.  Neurological: Negative for syncope and numbness. other than noted Hematological: Negative for adenopathy. or other swelling Psychiatric/Behavioral: Negative for hallucinations, SI, self-injury, decreased concentration or other worsening agitation.      Objective:   Physical Exam BP 108/58 mmHg  Pulse 81  Temp(Src) 97.6 F (36.4 C) (Oral)  Ht 5\' 3"  (1.6 m)  Wt 118 lb (53.524 kg)  BMI 20.91 kg/m2  SpO2 97% VS  noted,  Constitutional: Pt is oriented to person, place, and time. Appears well-developed and well-nourished, in no significant distress Head: Normocephalic and atraumatic.  Right Ear: External ear normal.  Left Ear: External ear normal.  Nose: Nose normal.  Mouth/Throat: Oropharynx is clear and moist.  Eyes: Conjunctivae and EOM are normal. Pupils are equal, round, and reactive  to light.  Neck: Normal range of motion. Neck supple. No JVD present. No tracheal deviation present or significant neck LA or mass Cardiovascular: Normal rate, regular rhythm, normal heart sounds and intact distal pulses.   Pulmonary/Chest: Effort normal and breath sounds without rales or wheezing  Abdominal: Soft. Bowel sounds are normal. NT. No HSM  Musculoskeletal: Normal range of motion. Exhibits no edema.  Lymphadenopathy:  Has no cervical adenopathy.  Neurological: Pt is alert and oriented to person, place, and time. Pt has normal reflexes. No cranial nerve deficit. Motor grossly intact Skin: Skin is warm and dry. No rash noted.  Psychiatric:  Has normal mood and affect. Behavior is normal.     Assessment & Plan:

## 2015-07-12 NOTE — Progress Notes (Signed)
Pre visit review using our clinic review tool, if applicable. No additional management support is needed unless otherwise documented below in the visit note. 

## 2015-07-12 NOTE — Assessment & Plan Note (Addendum)

## 2015-08-01 DIAGNOSIS — Z1211 Encounter for screening for malignant neoplasm of colon: Secondary | ICD-10-CM | POA: Diagnosis not present

## 2015-08-01 DIAGNOSIS — Z1212 Encounter for screening for malignant neoplasm of rectum: Secondary | ICD-10-CM | POA: Diagnosis not present

## 2015-08-01 LAB — COLOGUARD: Cologuard: NEGATIVE

## 2015-08-09 LAB — COLOGUARD: COLOGUARD: NEGATIVE

## 2015-08-18 LAB — COLOGUARD: COLOGUARD: NEGATIVE

## 2015-10-02 ENCOUNTER — Telehealth: Payer: Self-pay | Admitting: Internal Medicine

## 2015-10-02 NOTE — Telephone Encounter (Signed)
Pt called state that she send in the cologuard back on 08/01/15 but she never heard anything from our office about the result. Please call her back  Phone # [phone]

## 2015-10-03 NOTE — Telephone Encounter (Signed)
Per [employer] [facility], result was negative. Copy will be faxed for MD review. Pt advised of same via personal VM

## 2015-10-16 ENCOUNTER — Telehealth: Payer: Self-pay | Admitting: Internal Medicine

## 2015-10-16 MED ORDER — MEFLOQUINE HCL 250 MG PO TABS: 250.0000 mg | ORAL_TABLET | ORAL | Status: DC

## 2015-10-16 NOTE — Telephone Encounter (Signed)
This is 2 wks  OK for rx mefloquine to start 1 wk prior to leaving for trip

## 2015-10-16 NOTE — Telephone Encounter (Signed)
Patient called back.  States she will be in [place] for 14 days.

## 2015-10-16 NOTE — Telephone Encounter (Signed)
Would need to know how many weeks, since the prescription depends on this, thanks

## 2015-10-16 NOTE — Telephone Encounter (Signed)
Left message asking patient to call back with how many days/weeks she will be in [place]---after patient calls back with that number, this needs to be routed to dr Jonny Ruiz in order to complete rx request----can talk with tamara if any questions

## 2015-10-16 NOTE — Telephone Encounter (Signed)
Patient states she will be traveling to [place] and was told she needs malaria pills.  She is requesting a script to be sent to [facility] in [place].

## 2015-10-16 NOTE — Telephone Encounter (Signed)
Please advise 

## 2015-10-17 NOTE — Telephone Encounter (Signed)
Pt called back and informed.

## 2015-10-17 NOTE — Telephone Encounter (Signed)
Called patient to inform her on medication sent to pharmacy. Unable to reach patient, [facility]

## 2015-10-22 ENCOUNTER — Other Ambulatory Visit: Payer: Self-pay

## 2015-10-22 ENCOUNTER — Encounter: Payer: Self-pay | Admitting: Internal Medicine

## 2015-10-22 DIAGNOSIS — Z1231 Encounter for screening mammogram for malignant neoplasm of breast: Secondary | ICD-10-CM

## 2015-11-28 ENCOUNTER — Ambulatory Visit
Admission: RE | Admit: 2015-11-28 | Discharge: 2015-11-28 | Disposition: A | Discharge status: Home / Self Care | Payer: Medicare PPO | Source: Ambulatory Visit

## 2015-11-28 DIAGNOSIS — Z1231 Encounter for screening mammogram for malignant neoplasm of breast: Secondary | ICD-10-CM

## 2016-02-06 DIAGNOSIS — Z6821 Body mass index (BMI) 21.0-21.9, adult: Secondary | ICD-10-CM | POA: Diagnosis not present

## 2016-02-07 ENCOUNTER — Encounter: Payer: Self-pay | Admitting: Internal Medicine

## 2016-04-29 DIAGNOSIS — H52203 Unspecified astigmatism, bilateral: Secondary | ICD-10-CM | POA: Diagnosis not present

## 2016-04-29 DIAGNOSIS — H47321 Drusen of optic disc, right eye: Secondary | ICD-10-CM | POA: Diagnosis not present

## 2016-04-29 DIAGNOSIS — H5340 Unspecified visual field defects: Secondary | ICD-10-CM | POA: Diagnosis not present

## 2016-04-29 DIAGNOSIS — H353131 Nonexudative age-related macular degeneration, bilateral, early dry stage: Secondary | ICD-10-CM | POA: Diagnosis not present

## 2016-07-15 ENCOUNTER — Telehealth: Payer: Self-pay | Admitting: Internal Medicine

## 2016-07-15 DIAGNOSIS — Z0000 Encounter for general adult medical examination without abnormal findings: Secondary | ICD-10-CM

## 2016-07-15 NOTE — Telephone Encounter (Signed)
Lab orders placed.  

## 2016-07-15 NOTE — Telephone Encounter (Signed)
Pt called in and would like orders put in for lab work so she can get it done before her CPE

## 2016-08-05 ENCOUNTER — Other Ambulatory Visit (INDEPENDENT_AMBULATORY_CARE_PROVIDER_SITE_OTHER): Payer: Medicare PPO

## 2016-08-05 DIAGNOSIS — Z0000 Encounter for general adult medical examination without abnormal findings: Secondary | ICD-10-CM

## 2016-08-05 LAB — LIPID PANEL
CHOL/HDL RATIO: 2
CHOLESTEROL: 208 mg/dL — AB (ref 0–200)
HDL: 94.7 mg/dL (ref >39.00)
LDL CALC: 92 mg/dL (ref 0–99)
NonHDL: 113
TRIGLYCERIDES: 107 mg/dL (ref 0.0–149.0)
VLDL: 21.4 mg/dL (ref 0.0–40.0)

## 2016-08-05 LAB — HEPATIC FUNCTION PANEL
ALBUMIN: 4.4 g/dL (ref 3.5–5.2)
ALT: 14 U/L (ref 0–35)
AST: 15 U/L (ref 0–37)
Alkaline Phosphatase: 53 U/L (ref 39–117)
BILIRUBIN TOTAL: 0.5 mg/dL (ref 0.2–1.2)
Bilirubin, Direct: 0.1 mg/dL (ref 0.0–0.3)
Total Protein: 7.2 g/dL (ref 6.0–8.3)

## 2016-08-05 LAB — BASIC METABOLIC PANEL
BUN: 15 mg/dL (ref 6–23)
CO2: 29 mEq/L (ref 19–32)
CREATININE: 0.71 mg/dL (ref 0.40–1.20)
Calcium: 9.4 mg/dL (ref 8.4–10.5)
Chloride: 101 mEq/L (ref 96–112)
GFR: 85.13 mL/min (ref >60.00)
GLUCOSE: 95 mg/dL (ref 70–99)
Potassium: 4.5 mEq/L (ref 3.5–5.1)
Sodium: 135 mEq/L (ref 135–145)

## 2016-08-05 LAB — MICROALBUMIN / CREATININE URINE RATIO
Creatinine,U: 53 mg/dL
Microalb Creat Ratio: 1.3 mg/g (ref 0.0–30.0)
Microalb, Ur: <0.7 mg/dL (ref 0.0–1.9)

## 2016-08-05 LAB — CBC
HCT: 41.5 % (ref 36.0–46.0)
HEMOGLOBIN: 14.1 g/dL (ref 12.0–15.0)
MCHC: 34 g/dL (ref 30.0–36.0)
MCV: 91 fl (ref 78.0–100.0)
Platelets: 231 10*3/uL (ref 150.0–400.0)
RBC: 4.57 Mil/uL (ref 3.87–5.11)
RDW: 12.9 % (ref 11.5–15.5)
WBC: 8.1 10*3/uL (ref 4.0–10.5)

## 2016-08-05 LAB — HEMOGLOBIN A1C: Hgb A1c MFr Bld: 5.7 % (ref 4.6–6.5)

## 2016-08-05 LAB — TSH: TSH: 1.37 u[IU]/mL (ref 0.35–4.50)

## 2016-08-12 ENCOUNTER — Encounter: Payer: Self-pay | Admitting: Internal Medicine

## 2016-08-12 ENCOUNTER — Ambulatory Visit (INDEPENDENT_AMBULATORY_CARE_PROVIDER_SITE_OTHER): Payer: Medicare PPO | Admitting: Internal Medicine

## 2016-08-12 VITALS — BP 128/78 | HR 90 | Resp 20 | Wt 120.0 lb

## 2016-08-12 DIAGNOSIS — E785 Hyperlipidemia, unspecified: Secondary | ICD-10-CM | POA: Diagnosis not present

## 2016-08-12 DIAGNOSIS — F329 Major depressive disorder, single episode, unspecified: Secondary | ICD-10-CM

## 2016-08-12 DIAGNOSIS — F419 Anxiety disorder, unspecified: Secondary | ICD-10-CM

## 2016-08-12 DIAGNOSIS — H9192 Unspecified hearing loss, left ear: Secondary | ICD-10-CM | POA: Diagnosis not present

## 2016-08-12 DIAGNOSIS — Z0001 Encounter for general adult medical examination with abnormal findings: Secondary | ICD-10-CM

## 2016-08-12 DIAGNOSIS — M810 Age-related osteoporosis without current pathological fracture: Secondary | ICD-10-CM

## 2016-08-12 DIAGNOSIS — F418 Other specified anxiety disorders: Secondary | ICD-10-CM | POA: Diagnosis not present

## 2016-08-12 DIAGNOSIS — F32A Depression, unspecified: Secondary | ICD-10-CM | POA: Insufficient documentation

## 2016-08-12 NOTE — Patient Instructions (Signed)
Your left ear was irrigated of wax today  Please let us know if you change your mind about the medication for depression, Prolia, or statin  Please continue all other medications as before, and refills have been done if requested.  Please have the pharmacy call with any other refills you may need.  Please continue your efforts at being more active, low cholesterol diet, and weight control.  You are otherwise up to date with prevention measures today.  Please keep your appointments with your specialists as you may have planned  Please return in 1 year for your yearly visit, or sooner if needed, with Lab testing done 3-5 days before

## 2016-08-12 NOTE — Progress Notes (Signed)
Pre visit review using our clinic review tool, if applicable. No additional management support is needed unless otherwise documented below in the visit note. 

## 2016-08-12 NOTE — Progress Notes (Signed)
Subjective:    Patient ID: Erica MilchRita T Deller, female    DOB: Aug 11, 1941, 75 y.o.   MRN: [id number]  HPI  Here for wellness and f/u;  Overall doing ok;  Pt denies Chest pain, worsening SOB, DOE, wheezing, orthopnea, PND, worsening LE edema, palpitations, dizziness or syncope.  Pt denies neurological change such as new headache, facial or extremity weakness.  Pt denies polydipsia, polyuria, or low sugar symptoms. Pt states overall good compliance with treatment and medications, good tolerability, and has been trying to follow appropriate diet.  Pt states good ability with ADL's, has low fall risk, home safety reviewed and adequate, no other significant changes in hearing or vision, and only occasionally active with exercise.  Also has had mild worsening depressive symptoms, suicidal ideation, or panic; has ongoing anxiety, increased recently mostly due to family heatlh issues.  Questions about prolia, has read side effects and now does not want the med.  Wants her carotid arteries checked after 95yo mother died and sister has already had a stroke, brother also with stroke recently, youngest brother now in NH as well after stroke. Also with mild bilat hearing loss, ? Wax for 1-2 wks without pain or d/c,  Also with 2 "nodules" to neck but symmetric and long standing.  Belives her BP is high today at 128.78 Past Medical History:  Diagnosis Date  . CHICKENPOX, HX OF 06/12/2007  . HEPATITIS NOS 06/12/2007  . HYPERLIPIDEMIA 06/12/2007  . OSTEOARTHRITIS 06/12/2007  . OSTEOPENIA 05/24/2008  . SKIN LESION 07/03/2009  . UNSPECIFIED SUDDEN HEARING LOSS 07/03/2009   Past Surgical History:  Procedure Laterality Date  . ABDOMINAL HYSTERECTOMY    . breast biopsy    . OOPHORECTOMY     1 ovary  . TONSILLECTOMY      reports that she has never smoked. She does not have any smokeless tobacco history on file. She reports that she does not drink alcohol. Her drug history is not on file. family history includes Coronary  artery disease in her other; Diabetes in her other; Hypertension in her other. Allergies  Allergen Reactions  . Alendronate Sodium    Current Outpatient Prescriptions on File Prior to Visit  Medication Sig Dispense Refill  . aspirin 81 MG tablet Take 81 mg by mouth daily.      . beta carotene w/minerals (OCUVITE) tablet Take 1 tablet by mouth daily.    . Calcium Carbonate-Vitamin D (CALCIUM + D PO) Take by mouth 2 (two) times daily.      . mefloquine (LARIAM) 250 MG tablet Take 1 tablet (250 mg total) by mouth every 7 (seven) days. 5 tablet 0   No current facility-administered medications on file prior to visit.     Review of Systems Constitutional: Negative for increased diaphoresis, or other activity, appetite or siginficant weight change other than noted HENT: Negative for worsening hearing loss, ear pain, facial swelling, mouth sores and neck stiffness.   Eyes: Negative for other worsening pain, redness or visual disturbance.  Respiratory: Negative for choking or stridor Cardiovascular: Negative for other chest pain and palpitations.  Gastrointestinal: Negative for worsening diarrhea, blood in stool, or abdominal distention Genitourinary: Negative for hematuria, flank pain or change in urine volume.  Musculoskeletal: Negative for myalgias or other joint complaints.  Skin: Negative for other color change and wound or drainage.  Neurological: Negative for syncope and numbness. other than noted Hematological: Negative for adenopathy. or other swelling Psychiatric/Behavioral: Negative for hallucinations, SI, self-injury, decreased concentration or other worsening  agitation.  All other system neg per pt    Objective:   Physical Exam BP 128/78   Pulse 90   Resp 20   Wt 120 lb (54.4 kg)   SpO2 97%   BMI 21.26 kg/m  VS noted,  Constitutional: Pt is oriented to person, place, and time. Appears well-developed and well-nourished, in no significant distress Head: Normocephalic and  atraumatic  Eyes: Conjunctivae and EOM are normal. Pupils are equal, round, and reactive to light Right Ear: External ear normal.  Left Ear: External ear normal Nose: Nose normal.  Mouth/Throat: Oropharynx is clear and moist  Neck: Normal range of motion. Neck supple. No JVD present. No tracheal deviation present or significant neck LA or mass Cardiovascular: Normal rate, regular rhythm, normal heart sounds and intact distal pulses.   Pulmonary/Chest: Effort normal and breath sounds without rales or wheezing  Abdominal: Soft. Bowel sounds are normal. NT. No HSM  Musculoskeletal: Normal range of motion. Exhibits no edema Lymphadenopathy: Has no cervical adenopathy.  Neurological: Pt is alert and oriented to person, place, and time. Pt has normal reflexes. No cranial nerve deficit. Motor grossly intact Skin: Skin is warm and dry. No rash noted or new ulcers Psychiatric:  + nervous mild depressed affect mood and affect. Behavior is normal.  No other new exam findings    Assessment & Plan:

## 2016-08-12 NOTE — Assessment & Plan Note (Signed)
D/w pt, offered statin given her very family hx of stroke,. But she declines

## 2016-08-17 NOTE — Assessment & Plan Note (Signed)
D/w pt, declines prolia

## 2016-08-17 NOTE — Assessment & Plan Note (Signed)
D/w pt, declines med tx , counseling or psychiatry  In addition to the time spent performing CPE, I spent an additional 15 minutes face to face,in which greater than 50% of this time was spent in counseling and coordination of care for patient's illness as documented.

## 2016-08-17 NOTE — Assessment & Plan Note (Signed)

## 2016-08-17 NOTE — Assessment & Plan Note (Signed)
Improved after wax irrigation,  to f/u any worsening symptoms or concerns

## 2016-11-06 ENCOUNTER — Other Ambulatory Visit: Payer: Self-pay | Admitting: Anesthesiology

## 2016-11-06 DIAGNOSIS — Z1231 Encounter for screening mammogram for malignant neoplasm of breast: Secondary | ICD-10-CM

## 2016-12-01 ENCOUNTER — Other Ambulatory Visit: Payer: Self-pay | Admitting: Internal Medicine

## 2016-12-01 ENCOUNTER — Ambulatory Visit
Admission: RE | Admit: 2016-12-01 | Discharge: 2016-12-01 | Disposition: A | Discharge status: Home / Self Care | Payer: Medicare PPO | Source: Ambulatory Visit | Attending: Anesthesiology | Admitting: Anesthesiology

## 2016-12-01 DIAGNOSIS — Z1231 Encounter for screening mammogram for malignant neoplasm of breast: Secondary | ICD-10-CM

## 2016-12-11 DIAGNOSIS — H547 Unspecified visual loss: Secondary | ICD-10-CM | POA: Diagnosis not present

## 2016-12-11 DIAGNOSIS — Z7982 Long term (current) use of aspirin: Secondary | ICD-10-CM | POA: Diagnosis not present

## 2016-12-11 DIAGNOSIS — M1611 Unilateral primary osteoarthritis, right hip: Secondary | ICD-10-CM | POA: Diagnosis not present

## 2016-12-11 DIAGNOSIS — H353190 Nonexudative age-related macular degeneration, unspecified eye, stage unspecified: Secondary | ICD-10-CM | POA: Diagnosis not present

## 2016-12-11 DIAGNOSIS — Z6821 Body mass index (BMI) 21.0-21.9, adult: Secondary | ICD-10-CM | POA: Diagnosis not present

## 2017-03-13 DIAGNOSIS — D225 Melanocytic nevi of trunk: Secondary | ICD-10-CM | POA: Diagnosis not present

## 2017-03-13 DIAGNOSIS — D1801 Hemangioma of skin and subcutaneous tissue: Secondary | ICD-10-CM | POA: Diagnosis not present

## 2017-03-13 DIAGNOSIS — L821 Other seborrheic keratosis: Secondary | ICD-10-CM | POA: Diagnosis not present

## 2017-03-13 DIAGNOSIS — D485 Neoplasm of uncertain behavior of skin: Secondary | ICD-10-CM | POA: Diagnosis not present

## 2017-05-05 DIAGNOSIS — H47321 Drusen of optic disc, right eye: Secondary | ICD-10-CM | POA: Diagnosis not present

## 2017-05-05 DIAGNOSIS — H353131 Nonexudative age-related macular degeneration, bilateral, early dry stage: Secondary | ICD-10-CM | POA: Diagnosis not present

## 2017-05-05 DIAGNOSIS — H5203 Hypermetropia, bilateral: Secondary | ICD-10-CM | POA: Diagnosis not present

## 2017-05-05 DIAGNOSIS — H2513 Age-related nuclear cataract, bilateral: Secondary | ICD-10-CM | POA: Diagnosis not present

## 2017-07-23 ENCOUNTER — Ambulatory Visit (INDEPENDENT_AMBULATORY_CARE_PROVIDER_SITE_OTHER): Payer: Medicare PPO | Admitting: General Practice

## 2017-07-23 DIAGNOSIS — Z23 Encounter for immunization: Secondary | ICD-10-CM | POA: Diagnosis not present

## 2017-08-11 ENCOUNTER — Other Ambulatory Visit (INDEPENDENT_AMBULATORY_CARE_PROVIDER_SITE_OTHER): Payer: Medicare PPO

## 2017-08-11 DIAGNOSIS — Z0001 Encounter for general adult medical examination with abnormal findings: Secondary | ICD-10-CM | POA: Diagnosis not present

## 2017-08-11 LAB — TSH: TSH: 1.35 u[IU]/mL (ref 0.35–4.50)

## 2017-08-11 LAB — URINALYSIS, ROUTINE W REFLEX MICROSCOPIC
BILIRUBIN URINE: NEGATIVE
Leukocytes, UA: NEGATIVE
NITRITE: NEGATIVE
Specific Gravity, Urine: 1.01 (ref 1.000–1.030)
Total Protein, Urine: NEGATIVE
Urine Glucose: NEGATIVE
Urobilinogen, UA: 0.2 (ref 0.0–1.0)
pH: 6 (ref 5.0–8.0)

## 2017-08-11 LAB — CBC WITH DIFFERENTIAL/PLATELET
BASOS ABS: 0 10*3/uL (ref 0.0–0.1)
Basophils Relative: 0.7 % (ref 0.0–3.0)
EOS ABS: 0 10*3/uL (ref 0.0–0.7)
Eosinophils Relative: 0.5 % (ref 0.0–5.0)
HCT: 43.3 % (ref 36.0–46.0)
Hemoglobin: 14.6 g/dL (ref 12.0–15.0)
LYMPHS PCT: 34.7 % (ref 12.0–46.0)
Lymphs Abs: 1.7 10*3/uL (ref 0.7–4.0)
MCHC: 33.7 g/dL (ref 30.0–36.0)
MCV: 93.6 fl (ref 78.0–100.0)
MONOS PCT: 10.6 % (ref 3.0–12.0)
Monocytes Absolute: 0.5 10*3/uL (ref 0.1–1.0)
NEUTROS ABS: 2.6 10*3/uL (ref 1.4–7.7)
NEUTROS PCT: 53.5 % (ref 43.0–77.0)
PLATELETS: 230 10*3/uL (ref 150.0–400.0)
RBC: 4.63 Mil/uL (ref 3.87–5.11)
RDW: 12.9 % (ref 11.5–15.5)
WBC: 4.9 10*3/uL (ref 4.0–10.5)

## 2017-08-11 LAB — BASIC METABOLIC PANEL
BUN: 9 mg/dL (ref 6–23)
CHLORIDE: 102 meq/L (ref 96–112)
CO2: 28 meq/L (ref 19–32)
CREATININE: 0.71 mg/dL (ref 0.40–1.20)
Calcium: 9.5 mg/dL (ref 8.4–10.5)
GFR: 84.9 mL/min (ref >60.00)
Glucose, Bld: 99 mg/dL (ref 70–99)
Potassium: 4.2 mEq/L (ref 3.5–5.1)
SODIUM: 138 meq/L (ref 135–145)

## 2017-08-11 LAB — HEPATIC FUNCTION PANEL
ALBUMIN: 4.4 g/dL (ref 3.5–5.2)
ALK PHOS: 51 U/L (ref 39–117)
ALT: 14 U/L (ref 0–35)
AST: 18 U/L (ref 0–37)
Bilirubin, Direct: 0.1 mg/dL (ref 0.0–0.3)
Total Bilirubin: 0.6 mg/dL (ref 0.2–1.2)
Total Protein: 7.4 g/dL (ref 6.0–8.3)

## 2017-08-11 LAB — LIPID PANEL
CHOL/HDL RATIO: 2
Cholesterol: 209 mg/dL — ABNORMAL HIGH (ref 0–200)
HDL: 90.2 mg/dL (ref >39.00)
LDL Cholesterol: 106 mg/dL — ABNORMAL HIGH (ref 0–99)
NonHDL: 119.1
TRIGLYCERIDES: 65 mg/dL (ref 0.0–149.0)
VLDL: 13 mg/dL (ref 0.0–40.0)

## 2017-08-13 ENCOUNTER — Ambulatory Visit (INDEPENDENT_AMBULATORY_CARE_PROVIDER_SITE_OTHER): Payer: Medicare PPO | Admitting: Internal Medicine

## 2017-08-13 ENCOUNTER — Encounter: Payer: Self-pay | Admitting: Internal Medicine

## 2017-08-13 VITALS — BP 110/78 | HR 80 | Temp 97.7°F | Ht 63.0 in | Wt 117.0 lb

## 2017-08-13 DIAGNOSIS — R300 Dysuria: Secondary | ICD-10-CM | POA: Diagnosis not present

## 2017-08-13 DIAGNOSIS — M8580 Other specified disorders of bone density and structure, unspecified site: Secondary | ICD-10-CM

## 2017-08-13 DIAGNOSIS — E2839 Other primary ovarian failure: Secondary | ICD-10-CM | POA: Diagnosis not present

## 2017-08-13 DIAGNOSIS — Z0000 Encounter for general adult medical examination without abnormal findings: Secondary | ICD-10-CM | POA: Diagnosis not present

## 2017-08-13 MED ORDER — ZOSTER VAC RECOMB ADJUVANTED 50 MCG/0.5ML IM SUSR: 0.5000 mL | Freq: Once | INTRAMUSCULAR | 1 refills | Status: AC

## 2017-08-13 NOTE — Progress Notes (Signed)
Subjective:    Patient ID: Erica Patterson, female    DOB: 13-Nov-1940, 76 y.o.   MRN: [id number]  HPI  Here for wellness and f/u;  Overall doing ok;  Pt denies Chest pain, worsening SOB, DOE, wheezing, orthopnea, PND, worsening LE edema, palpitations, dizziness or syncope.  Pt denies neurological change such as new headache, facial or extremity weakness.  Pt denies polydipsia, polyuria, or low sugar symptoms. Pt states overall good compliance with treatment and medications, good tolerability, and has been trying to follow appropriate diet.  Pt denies worsening depressive symptoms, suicidal ideation or panic. No fever, night sweats, wt loss, loss of appetite, or other constitutional symptoms.  Pt states good ability with ADL's, has low fall risk, home safety reviewed and adequate, no other significant changes in vision, and only occasionally active with exercise.  Has had about 2 wks decreased hearing to left ear - ? Wax again.  Stays fairly active, walks 3.5 miles per day.   Wt Readings from Last 3 Encounters:  08/13/17 117 lb (53.1 kg)  08/12/16 120 lb (54.4 kg)  07/12/15 118 lb (53.5 kg)  Saw dermatology - s/p dysplastic nevus of right arm.  Has other tiny black lesions to review at 6 mo f/u.  Due for hearing aids soon.  Denies urinary symptoms such as frequency, urgency, flank pain, gross hematuria or n/v, fever, chills, but has had very mild occasional dysuria for the past wk. Past Medical History:  Diagnosis Date  . CHICKENPOX, HX OF 06/12/2007  . HEPATITIS NOS 06/12/2007  . HYPERLIPIDEMIA 06/12/2007  . OSTEOARTHRITIS 06/12/2007  . OSTEOPENIA 05/24/2008  . SKIN LESION 07/03/2009  . UNSPECIFIED SUDDEN HEARING LOSS 07/03/2009   Past Surgical History:  Procedure Laterality Date  . ABDOMINAL HYSTERECTOMY    . breast biopsy    . OOPHORECTOMY     1 ovary  . TONSILLECTOMY      reports that  has never smoked. she has never used smokeless tobacco. She reports that she does not drink alcohol. Her  drug history is not on file. family history includes Breast cancer in her cousin, cousin, cousin, and cousin; Coronary artery disease in her other; Diabetes in her other; Hypertension in her other. Allergies  Allergen Reactions  . Alendronate Sodium    Current Outpatient Medications on File Prior to Visit  Medication Sig Dispense Refill  . aspirin 81 MG tablet Take 81 mg by mouth daily.      . beta carotene w/minerals (OCUVITE) tablet Take 1 tablet by mouth daily.    . Calcium Carbonate-Vitamin D (CALCIUM + D PO) Take by mouth 2 (two) times daily.       No current facility-administered medications on file prior to visit.    Review of Systems Constitutional: Negative for other unusual diaphoresis, sweats, appetite or weight changes HENT: Negative for other worsening hearing loss, ear pain, facial swelling, mouth sores or neck stiffness.   Eyes: Negative for other worsening pain, redness or other visual disturbance.  Respiratory: Negative for other stridor or swelling Cardiovascular: Negative for other palpitations or other chest pain  Gastrointestinal: Negative for worsening diarrhea or loose stools, blood in stool, distention or other pain Genitourinary: Negative for hematuria, flank pain or other change in urine volume.  Musculoskeletal: Negative for myalgias or other joint swelling.  Skin: Negative for other color change, or other wound or worsening drainage.  Neurological: Negative for other syncope or numbness. Hematological: Negative for other adenopathy or swelling Psychiatric/Behavioral: Negative for hallucinations,  other worsening agitation, SI, self-injury, or new decreased concentration All other system neg per pt    Objective:   Physical Exam BP 110/78   Pulse 80   Temp 97.7 F (36.5 C) (Oral)   Ht 5\' 3"  (1.6 m)   Wt 117 lb (53.1 kg)   SpO2 99%   BMI 20.73 kg/m  VS noted,  Constitutional: Pt is oriented to person, place, and time. Appears well-developed and  well-nourished, in no significant distress and comfortable Head: Normocephalic and atraumatic  Eyes: Conjunctivae and EOM are normal. Pupils are equal, round, and reactive to light Right Ear: External ear normal without discharge Left Ear: External ear normal without discharge  Left canal irrigated clear of wax and hearing improved Nose: Nose without discharge or deformity Mouth/Throat: Oropharynx is without other ulcerations and moist  Neck: Normal range of motion. Neck supple. No JVD present. No tracheal deviation present or significant neck LA or mass Cardiovascular: Normal rate, regular rhythm, normal heart sounds and intact distal pulses.   Pulmonary/Chest: WOB normal and breath sounds without rales or wheezing  Abdominal: Soft. Bowel sounds are normal. NT. No HSM  Musculoskeletal: Normal range of motion. Exhibits no edema Lymphadenopathy: Has no other cervical adenopathy.  Neurological: Pt is alert and oriented to person, place, and time. Pt has normal reflexes. No cranial nerve deficit. Motor grossly intact, Gait intact Skin: Skin is warm and dry. No rash noted or new ulcerations Psychiatric:  Has mild nervous mood and affect. Behavior is normal without agitation No other exam findings    Assessment & Plan:

## 2017-08-13 NOTE — Patient Instructions (Addendum)
Please schedule the bone density test before leaving today at the scheduling desk (where you check out)  You had the Shingrix prescription sent to the pharmacy  Your left ear was irrigated of wax today  Please call for the Urology referral if you change your mind  Please continue all other medications as before, and refills have been done if requested.  Please have the pharmacy call with any other refills you may need.  Please continue your efforts at being more active, low cholesterol diet, and weight control.  You are otherwise up to date with prevention measures today.  Please keep your appointments with your specialists as you may have planned  Please return in 1 year for your yearly visit, or sooner if needed, with Lab testing done 3-5 days before

## 2017-08-16 ENCOUNTER — Encounter: Payer: Self-pay | Admitting: Internal Medicine

## 2017-08-16 DIAGNOSIS — R300 Dysuria: Secondary | ICD-10-CM | POA: Insufficient documentation

## 2017-08-16 NOTE — Assessment & Plan Note (Signed)
Very mild, of ? Clinical significance, also with recent microhematuria o/w asymptomatic, d/w pt - declines urology referral

## 2017-08-16 NOTE — Assessment & Plan Note (Signed)
For f/u DXA, consider prolia after d/w pt today if worsening to osteoporosis

## 2017-08-16 NOTE — Assessment & Plan Note (Signed)

## 2017-08-31 DIAGNOSIS — D485 Neoplasm of uncertain behavior of skin: Secondary | ICD-10-CM | POA: Diagnosis not present

## 2017-08-31 DIAGNOSIS — L821 Other seborrheic keratosis: Secondary | ICD-10-CM | POA: Diagnosis not present

## 2017-08-31 DIAGNOSIS — D225 Melanocytic nevi of trunk: Secondary | ICD-10-CM | POA: Diagnosis not present

## 2017-10-20 ENCOUNTER — Ambulatory Visit (INDEPENDENT_AMBULATORY_CARE_PROVIDER_SITE_OTHER)
Admission: RE | Admit: 2017-10-20 | Discharge: 2017-10-20 | Disposition: A | Discharge status: Home / Self Care | Payer: Medicare PPO | Source: Ambulatory Visit | Attending: Internal Medicine | Admitting: Internal Medicine

## 2017-10-20 DIAGNOSIS — E2839 Other primary ovarian failure: Secondary | ICD-10-CM | POA: Diagnosis not present

## 2017-10-26 ENCOUNTER — Other Ambulatory Visit: Payer: Self-pay | Admitting: Internal Medicine

## 2017-10-26 ENCOUNTER — Encounter: Payer: Self-pay | Admitting: Internal Medicine

## 2017-10-26 DIAGNOSIS — M810 Age-related osteoporosis without current pathological fracture: Secondary | ICD-10-CM | POA: Insufficient documentation

## 2017-10-26 MED ORDER — ALENDRONATE SODIUM 70 MG PO TABS: 70.0000 mg | ORAL_TABLET | ORAL | 3 refills | Status: DC

## 2017-10-27 ENCOUNTER — Telehealth: Payer: Self-pay

## 2017-10-27 NOTE — Telephone Encounter (Signed)
Patient's insurance will be verified for summary of benefits on prolia---I will contact patient to discuss findings

## 2017-10-27 NOTE — Telephone Encounter (Signed)
She does qualify as worst t score is -2.9  Ok for prolia  I will forward also to Claritaamara to help with assist on starting med -

## 2017-10-27 NOTE — Telephone Encounter (Signed)
-----   Message from Corwin LevinsJames W John, MD sent at 10/26/2017  5:18 PM EST ----- Left message on MyChart, pt to cont same tx except  The test results show that your current treatment is OK, except the bone density test is consistent with new osteoporosis. Please start fosamax 70 mg weekly, as this will help to slow the bone loss.  Shirron to please inform pt, I will do rx

## 2017-10-27 NOTE — Telephone Encounter (Signed)
Pt has been informed and expressed understanding. Patient states that she does not want to take Fosamax since taking it back in the 90's and it ruined her esophagus. She would like to know her other options. She is interested in Prolia. She would also like to know the numbers of the bone density result. I am not sure how to properly read the report. Please advise.

## 2017-11-02 ENCOUNTER — Other Ambulatory Visit: Payer: Self-pay | Admitting: Internal Medicine

## 2017-11-02 DIAGNOSIS — Z1231 Encounter for screening mammogram for malignant neoplasm of breast: Secondary | ICD-10-CM

## 2017-11-11 ENCOUNTER — Telehealth: Payer: Self-pay

## 2017-11-11 NOTE — Telephone Encounter (Signed)
Left message asking patient to call back to schedule nurse visit to get first shingrix injection---2019 insurance has been verified and patient has estimated $250 copay after her deductible---if not met her deductible she could also be billed additional cost for deductible---can schedule nurse visit at her earliest convenience or can talk with tamara,RN at [place] office if further questions

## 2017-12-02 ENCOUNTER — Ambulatory Visit
Admission: RE | Admit: 2017-12-02 | Discharge: 2017-12-02 | Disposition: A | Discharge status: Home / Self Care | Payer: Medicare PPO | Source: Ambulatory Visit | Attending: Internal Medicine | Admitting: Internal Medicine

## 2017-12-02 DIAGNOSIS — Z1231 Encounter for screening mammogram for malignant neoplasm of breast: Secondary | ICD-10-CM | POA: Diagnosis not present

## 2017-12-09 ENCOUNTER — Telehealth: Payer: Self-pay

## 2017-12-09 NOTE — Telephone Encounter (Signed)
Patient requested a call back regarding questions about side effects for fosamax and prolia----I have left a message asking patient to return my call---[facility]wu [facility]ist[facility]

## 2017-12-10 NOTE — Telephone Encounter (Signed)
Patient returned call, advised of copay and answered further prolia questions, patient is currently traveling and will call back in about 1 month to schedule nurse visit to get started with first injection

## 2017-12-10 NOTE — Telephone Encounter (Signed)
Patient returned call, I have answered additional questions about prolia vs. Fosamax---patient will call back in about 1 month to schedule nurse visit

## 2018-02-03 ENCOUNTER — Ambulatory Visit: Payer: Medicare PPO

## 2018-02-05 ENCOUNTER — Ambulatory Visit (INDEPENDENT_AMBULATORY_CARE_PROVIDER_SITE_OTHER): Payer: Medicare PPO

## 2018-02-05 DIAGNOSIS — M810 Age-related osteoporosis without current pathological fracture: Secondary | ICD-10-CM | POA: Diagnosis not present

## 2018-02-05 MED ORDER — DENOSUMAB 60 MG/ML ~~LOC~~ SOSY
60.0000 mg | PREFILLED_SYRINGE | Freq: Once | SUBCUTANEOUS | Status: AC
  Administered 2018-02-05: 60 mg via SUBCUTANEOUS

## 2018-02-05 NOTE — Progress Notes (Signed)
Medical screening examination/treatment/procedure(s) were performed by non-physician practitioner and as supervising physician I was immediately available for consultation/collaboration. I agree with above. Koleson Reifsteck, MD   

## 2018-03-12 DIAGNOSIS — D225 Melanocytic nevi of trunk: Secondary | ICD-10-CM | POA: Diagnosis not present

## 2018-03-12 DIAGNOSIS — L821 Other seborrheic keratosis: Secondary | ICD-10-CM | POA: Diagnosis not present

## 2018-03-12 DIAGNOSIS — D1801 Hemangioma of skin and subcutaneous tissue: Secondary | ICD-10-CM | POA: Diagnosis not present

## 2018-03-12 DIAGNOSIS — L578 Other skin changes due to chronic exposure to nonionizing radiation: Secondary | ICD-10-CM | POA: Diagnosis not present

## 2018-03-12 DIAGNOSIS — D485 Neoplasm of uncertain behavior of skin: Secondary | ICD-10-CM | POA: Diagnosis not present

## 2018-03-23 ENCOUNTER — Ambulatory Visit (INDEPENDENT_AMBULATORY_CARE_PROVIDER_SITE_OTHER): Payer: Medicare PPO | Admitting: Internal Medicine

## 2018-03-23 ENCOUNTER — Encounter: Payer: Self-pay | Admitting: Internal Medicine

## 2018-03-23 VITALS — Ht 63.0 in

## 2018-03-23 DIAGNOSIS — H9192 Unspecified hearing loss, left ear: Secondary | ICD-10-CM

## 2018-03-24 NOTE — Patient Instructions (Signed)
none

## 2018-03-24 NOTE — Progress Notes (Signed)
Pt not seen.  Pt believed she has cerumen impactions to account for hearing loss.  After nurse observation of no wax impaction, she declines VS or other exam

## 2018-05-06 ENCOUNTER — Telehealth: Payer: Self-pay | Admitting: Internal Medicine

## 2018-05-06 NOTE — Telephone Encounter (Signed)
Copied from CRM [id number]. Topic: General - Other >> May 06, 2018  9:00 AM Erica Patterson, Erica Patterson wrote: Reason for CRM: pt is calling and had her last prolia injection in may 2019. Pt would like to have the next prolia injection on nov 20. Pt is schedule for her yearly exam with Erica Patterson on 08-18-18

## 2018-05-06 NOTE — Telephone Encounter (Signed)
Called patient and left message letting her know that it is fine to do it on November 20th. We have not verified her insurance yet for the cost of the injection but when we do, I will call her and let her know. Should be in the next month or so.

## 2018-07-16 DIAGNOSIS — H353131 Nonexudative age-related macular degeneration, bilateral, early dry stage: Secondary | ICD-10-CM | POA: Diagnosis not present

## 2018-07-16 DIAGNOSIS — H52203 Unspecified astigmatism, bilateral: Secondary | ICD-10-CM | POA: Diagnosis not present

## 2018-07-16 DIAGNOSIS — H2513 Age-related nuclear cataract, bilateral: Secondary | ICD-10-CM | POA: Diagnosis not present

## 2018-07-16 DIAGNOSIS — H47321 Drusen of optic disc, right eye: Secondary | ICD-10-CM | POA: Diagnosis not present

## 2018-08-18 ENCOUNTER — Ambulatory Visit (INDEPENDENT_AMBULATORY_CARE_PROVIDER_SITE_OTHER): Payer: Medicare PPO | Admitting: Internal Medicine

## 2018-08-18 ENCOUNTER — Other Ambulatory Visit (INDEPENDENT_AMBULATORY_CARE_PROVIDER_SITE_OTHER): Payer: Medicare PPO

## 2018-08-18 ENCOUNTER — Encounter: Payer: Self-pay | Admitting: Internal Medicine

## 2018-08-18 VITALS — BP 122/78 | HR 82 | Temp 98.0°F | Ht 63.0 in | Wt 118.0 lb

## 2018-08-18 DIAGNOSIS — Z0000 Encounter for general adult medical examination without abnormal findings: Secondary | ICD-10-CM

## 2018-08-18 DIAGNOSIS — M810 Age-related osteoporosis without current pathological fracture: Secondary | ICD-10-CM

## 2018-08-18 LAB — URINALYSIS, ROUTINE W REFLEX MICROSCOPIC
BILIRUBIN URINE: NEGATIVE
Ketones, ur: 40 — AB
Leukocytes, UA: NEGATIVE
Nitrite: NEGATIVE
Specific Gravity, Urine: 1.02 (ref 1.000–1.030)
TOTAL PROTEIN, URINE-UPE24: NEGATIVE
URINE GLUCOSE: NEGATIVE
Urobilinogen, UA: 0.2 (ref 0.0–1.0)
pH: 6 (ref 5.0–8.0)

## 2018-08-18 LAB — CBC WITH DIFFERENTIAL/PLATELET
BASOS ABS: 0 10*3/uL (ref 0.0–0.1)
Basophils Relative: 0.6 % (ref 0.0–3.0)
EOS ABS: 0 10*3/uL (ref 0.0–0.7)
Eosinophils Relative: 0.2 % (ref 0.0–5.0)
HCT: 42 % (ref 36.0–46.0)
Hemoglobin: 14.2 g/dL (ref 12.0–15.0)
LYMPHS ABS: 1.4 10*3/uL (ref 0.7–4.0)
LYMPHS PCT: 26.2 % (ref 12.0–46.0)
MCHC: 33.9 g/dL (ref 30.0–36.0)
MCV: 91.8 fl (ref 78.0–100.0)
MONOS PCT: 10.1 % (ref 3.0–12.0)
Monocytes Absolute: 0.5 10*3/uL (ref 0.1–1.0)
NEUTROS ABS: 3.4 10*3/uL (ref 1.4–7.7)
Neutrophils Relative %: 62.9 % (ref 43.0–77.0)
Platelets: 209 10*3/uL (ref 150.0–400.0)
RBC: 4.58 Mil/uL (ref 3.87–5.11)
RDW: 13 % (ref 11.5–15.5)
WBC: 5.4 10*3/uL (ref 4.0–10.5)

## 2018-08-18 LAB — HEPATIC FUNCTION PANEL
ALT: 15 U/L (ref 0–35)
AST: 19 U/L (ref 0–37)
Albumin: 4.5 g/dL (ref 3.5–5.2)
Alkaline Phosphatase: 43 U/L (ref 39–117)
BILIRUBIN DIRECT: 0.1 mg/dL (ref 0.0–0.3)
BILIRUBIN TOTAL: 0.6 mg/dL (ref 0.2–1.2)
Total Protein: 7.3 g/dL (ref 6.0–8.3)

## 2018-08-18 LAB — LIPID PANEL
CHOL/HDL RATIO: 2
CHOLESTEROL: 192 mg/dL (ref 0–200)
HDL: 91.5 mg/dL (ref >39.00)
LDL CALC: 90 mg/dL (ref 0–99)
NonHDL: 100.7
Triglycerides: 55 mg/dL (ref 0.0–149.0)
VLDL: 11 mg/dL (ref 0.0–40.0)

## 2018-08-18 LAB — TSH: TSH: 1.47 u[IU]/mL (ref 0.35–4.50)

## 2018-08-18 LAB — BASIC METABOLIC PANEL
BUN: 9 mg/dL (ref 6–23)
CHLORIDE: 100 meq/L (ref 96–112)
CO2: 25 mEq/L (ref 19–32)
CREATININE: 0.7 mg/dL (ref 0.40–1.20)
Calcium: 9.2 mg/dL (ref 8.4–10.5)
GFR: 86.07 mL/min (ref >60.00)
GLUCOSE: 92 mg/dL (ref 70–99)
POTASSIUM: 3.9 meq/L (ref 3.5–5.1)
Sodium: 136 mEq/L (ref 135–145)

## 2018-08-18 MED ORDER — DENOSUMAB 60 MG/ML ~~LOC~~ SOSY
60.0000 mg | PREFILLED_SYRINGE | Freq: Once | SUBCUTANEOUS | Status: AC
  Administered 2018-08-18: 60 mg via SUBCUTANEOUS

## 2018-08-18 MED ORDER — ZOSTER VAC RECOMB ADJUVANTED 50 MCG/0.5ML IM SUSR: 0.5000 mL | Freq: Once | INTRAMUSCULAR | 1 refills | Status: AC

## 2018-08-18 NOTE — Assessment & Plan Note (Signed)
For prolia asd today

## 2018-08-18 NOTE — Assessment & Plan Note (Signed)

## 2018-08-18 NOTE — Patient Instructions (Addendum)
You will have the Prolia shot today  Your shingles shot prescription was sent to [facility]  Please continue all other medications as before, and refills have been done if requested.  Please have the pharmacy call with any other refills you may need.  Please continue your efforts at being more active, low cholesterol diet, and weight control.  You are otherwise up to date with prevention measures today.  Please keep your appointments with your specialists as you may have planned  Please go to the LAB in the Basement (turn left off the elevator) for the tests to be done today  You will be contacted by phone if any changes need to be made immediately.  Otherwise, you will receive a letter about your results with an explanation, but please check with MyChart first.  Please remember to sign up for MyChart if you have not done so, as this will be important to you in the future with finding out test results, communicating by private email, and scheduling acute appointments online when needed.  Please return in 1 year for your yearly visit, or sooner if needed, with Lab testing done 3-5 days before

## 2018-08-18 NOTE — Progress Notes (Signed)
Subjective:    Patient ID: Erica Patterson, female    DOB: 14-Mar-1941, 77 y.o.   MRN: [id number]  HPI  Here for wellness and f/u;  Overall doing ok;  Pt denies Chest pain, worsening SOB, DOE, wheezing, orthopnea, PND, worsening LE edema, palpitations, dizziness or syncope.  Pt denies neurological change such as new headache, facial or extremity weakness.  Pt denies polydipsia, polyuria, or low sugar symptoms. Pt states overall good compliance with treatment and medications, good tolerability, and has been trying to follow appropriate diet.  Pt denies worsening depressive symptoms, suicidal ideation or panic. No fever, night sweats, wt loss, loss of appetite, or other constitutional symptoms.  Pt states good ability with ADL's, has low fall risk, home safety reviewed and adequate, no other significant changes in hearing or vision, and only occasionally active with exercise. Has been ruminating about death lately, worried she might get dementia some day, her mother lived to 72.  Has some trouble with sleep and plans to try melatonin.  Only had 2 episodes small of vertigo.  Doing Tai Chi and light wts 3 times per wk.  No new complaints Past Medical History:  Diagnosis Date  . CHICKENPOX, HX OF 06/12/2007  . HEPATITIS NOS 06/12/2007  . HYPERLIPIDEMIA 06/12/2007  . OSTEOARTHRITIS 06/12/2007  . OSTEOPENIA 05/24/2008  . SKIN LESION 07/03/2009  . UNSPECIFIED SUDDEN HEARING LOSS 07/03/2009   Past Surgical History:  Procedure Laterality Date  . ABDOMINAL HYSTERECTOMY    . breast biopsy    . BREAST EXCISIONAL BIOPSY Bilateral    benign  . OOPHORECTOMY     1 ovary  . TONSILLECTOMY      reports that she has never smoked. She has never used smokeless tobacco. She reports that she does not drink alcohol. Her drug history is not on file. family history includes Breast cancer in her cousin, cousin, cousin, and cousin; Coronary artery disease in her other; Diabetes in her other; Hypertension in her  other. Allergies  Allergen Reactions  . Alendronate Sodium    Current Outpatient Medications on File Prior to Visit  Medication Sig Dispense Refill  . aspirin 81 MG tablet Take 81 mg by mouth daily.      . beta carotene w/minerals (OCUVITE) tablet Take 1 tablet by mouth daily.    . Calcium Carbonate-Vitamin D (CALCIUM + D PO) Take by mouth 2 (two) times daily.       No current facility-administered medications on file prior to visit.    Review of Systems Constitutional: Negative for other unusual diaphoresis, sweats, appetite or weight changes HENT: Negative for other worsening hearing loss, ear pain, facial swelling, mouth sores or neck stiffness.   Eyes: Negative for other worsening pain, redness or other visual disturbance.  Respiratory: Negative for other stridor or swelling Cardiovascular: Negative for other palpitations or other chest pain  Gastrointestinal: Negative for worsening diarrhea or loose stools, blood in stool, distention or other pain Genitourinary: Negative for hematuria, flank pain or other change in urine volume.  Musculoskeletal: Negative for myalgias or other joint swelling.  Skin: Negative for other color change, or other wound or worsening drainage.  Neurological: Negative for other syncope or numbness. Hematological: Negative for other adenopathy or swelling Psychiatric/Behavioral: Negative for hallucinations, other worsening agitation, SI, self-injury, or new decreased concentration All other system neg per pt    Objective:   Physical Exam BP 122/78   Pulse 82   Temp 98 F (36.7 C) (Oral)   Ht 5'  3" (1.6 m)   Wt 118 lb (53.5 kg)   SpO2 98%   BMI 20.90 kg/m  VS noted,  Constitutional: Pt is oriented to person, place, and time. Appears well-developed and well-nourished, in no significant distress and comfortable Head: Normocephalic and atraumatic  Eyes: Conjunctivae and EOM are normal. Pupils are equal, round, and reactive to light Right Ear:  External ear normal without discharge Left Ear: External ear normal without discharge Nose: Nose without discharge or deformity Mouth/Throat: Oropharynx is without other ulcerations and moist  Neck: Normal range of motion. Neck supple. No JVD present. No tracheal deviation present or significant neck LA or mass Cardiovascular: Normal rate, regular rhythm, normal heart sounds and intact distal pulses.   Pulmonary/Chest: WOB normal and breath sounds without rales or wheezing  Abdominal: Soft. Bowel sounds are normal. NT. No HSM  Musculoskeletal: Normal range of motion. Exhibits no edema Lymphadenopathy: Has no other cervical adenopathy.  Neurological: Pt is alert and oriented to person, place, and time. Pt has normal reflexes. No cranial nerve deficit. Motor grossly intact, Gait intact Skin: Skin is warm and dry. No rash noted or new ulcerations Psychiatric:  Has mild nervous mood and affect. Behavior is normal without agitation No other exam findings Lab Results  Component Value Date   WBC 5.4 08/18/2018   HGB 14.2 08/18/2018   HCT 42.0 08/18/2018   PLT 209.0 08/18/2018   GLUCOSE 92 08/18/2018   CHOL 192 08/18/2018   TRIG 55.0 08/18/2018   HDL 91.50 08/18/2018   LDLDIRECT 110.2 06/30/2013   LDLCALC 90 08/18/2018   ALT 15 08/18/2018   AST 19 08/18/2018   NA 136 08/18/2018   K 3.9 08/18/2018   CL 100 08/18/2018   CREATININE 0.70 08/18/2018   BUN 9 08/18/2018   CO2 25 08/18/2018   TSH 1.47 08/18/2018   HGBA1C 5.7 08/05/2016   MICROALBUR <0.7 08/05/2016       Assessment & Plan:

## 2018-11-03 ENCOUNTER — Other Ambulatory Visit: Payer: Self-pay | Admitting: Internal Medicine

## 2018-11-03 DIAGNOSIS — Z1231 Encounter for screening mammogram for malignant neoplasm of breast: Secondary | ICD-10-CM

## 2018-12-07 ENCOUNTER — Ambulatory Visit
Admission: RE | Admit: 2018-12-07 | Discharge: 2018-12-07 | Disposition: A | Discharge status: Home / Self Care | Payer: Medicare PPO | Source: Ambulatory Visit | Attending: Internal Medicine | Admitting: Internal Medicine

## 2018-12-07 DIAGNOSIS — Z1231 Encounter for screening mammogram for malignant neoplasm of breast: Secondary | ICD-10-CM

## 2019-02-07 ENCOUNTER — Telehealth: Payer: Self-pay | Admitting: Internal Medicine

## 2019-02-07 NOTE — Telephone Encounter (Signed)
Patient requesting to have prolia authorized and scheduled.

## 2019-02-08 NOTE — Telephone Encounter (Signed)
Sounds good, I agree to start Prolia for this pt, thanks

## 2019-02-08 NOTE — Telephone Encounter (Signed)
Patient had bone density in jan/2019 resulting in 2.9 tscore---per dr Raphael Gibney note, patient told to start fosamax for bone loss supplementation----patient is now requesting to start prolia, routing to dr Jonny Ruiz, are you ok with patient doing this, please advise, I will call patient to discuss prolia, and insurance summary of benefits, thanks

## 2019-02-09 NOTE — Telephone Encounter (Signed)
Insurance summary of benefits will be submitted and I will call patient to discuss prolia injections

## 2019-03-07 NOTE — Telephone Encounter (Signed)
Pt called regarding prolia shot. Pt states the approval letter she received was dated back 02/11/2019. Please advise.

## 2019-03-07 NOTE — Telephone Encounter (Signed)
Patient is following up on this. She states she got a letter in the mail saying it has been approved.

## 2019-03-08 NOTE — Telephone Encounter (Signed)
We do now have faxed approval letter, we will be calling patient to schedule nurse visit

## 2019-03-14 NOTE — Telephone Encounter (Signed)
Pt calling back to check status of scheduling an appointment. Please advise   [phone]

## 2019-03-14 NOTE — Telephone Encounter (Signed)
Appointment scheduled.

## 2019-03-17 ENCOUNTER — Ambulatory Visit (INDEPENDENT_AMBULATORY_CARE_PROVIDER_SITE_OTHER): Payer: Medicare PPO

## 2019-03-17 DIAGNOSIS — M810 Age-related osteoporosis without current pathological fracture: Secondary | ICD-10-CM

## 2019-03-17 MED ORDER — DENOSUMAB 60 MG/ML ~~LOC~~ SOSY
60.0000 mg | PREFILLED_SYRINGE | Freq: Once | SUBCUTANEOUS | Status: AC
  Administered 2019-03-17: 60 mg via SUBCUTANEOUS

## 2019-03-17 NOTE — Progress Notes (Signed)
Medical screening examination/treatment/procedure(s) were performed by non-physician practitioner and as supervising physician I was immediately available for consultation/collaboration. I agree with above. Chloeann Alfred, MD   

## 2019-07-12 DIAGNOSIS — Z79891 Long term (current) use of opiate analgesic: Secondary | ICD-10-CM | POA: Diagnosis not present

## 2019-07-12 DIAGNOSIS — M810 Age-related osteoporosis without current pathological fracture: Secondary | ICD-10-CM | POA: Diagnosis not present

## 2019-07-12 DIAGNOSIS — H3530 Unspecified macular degeneration: Secondary | ICD-10-CM | POA: Diagnosis not present

## 2019-07-12 DIAGNOSIS — Z79899 Other long term (current) drug therapy: Secondary | ICD-10-CM | POA: Diagnosis not present

## 2019-07-26 DIAGNOSIS — H47321 Drusen of optic disc, right eye: Secondary | ICD-10-CM | POA: Diagnosis not present

## 2019-07-26 DIAGNOSIS — H353111 Nonexudative age-related macular degeneration, right eye, early dry stage: Secondary | ICD-10-CM | POA: Diagnosis not present

## 2019-07-26 DIAGNOSIS — H52203 Unspecified astigmatism, bilateral: Secondary | ICD-10-CM | POA: Diagnosis not present

## 2019-07-26 DIAGNOSIS — H2513 Age-related nuclear cataract, bilateral: Secondary | ICD-10-CM | POA: Diagnosis not present

## 2019-08-18 ENCOUNTER — Other Ambulatory Visit (INDEPENDENT_AMBULATORY_CARE_PROVIDER_SITE_OTHER): Payer: Medicare PPO

## 2019-08-18 DIAGNOSIS — Z0000 Encounter for general adult medical examination without abnormal findings: Secondary | ICD-10-CM | POA: Diagnosis not present

## 2019-08-18 LAB — BASIC METABOLIC PANEL
BUN: 10 mg/dL (ref 6–23)
CO2: 24 mEq/L (ref 19–32)
Calcium: 8.8 mg/dL (ref 8.4–10.5)
Chloride: 100 mEq/L (ref 96–112)
Creatinine, Ser: 0.7 mg/dL (ref 0.40–1.20)
GFR: 80.77 mL/min (ref >60.00)
Glucose, Bld: 94 mg/dL (ref 70–99)
Potassium: 3.6 mEq/L (ref 3.5–5.1)
Sodium: 136 mEq/L (ref 135–145)

## 2019-08-18 LAB — URINALYSIS, ROUTINE W REFLEX MICROSCOPIC
Bilirubin Urine: NEGATIVE
Ketones, ur: 40 — AB
Nitrite: NEGATIVE
Specific Gravity, Urine: 1.025 (ref 1.000–1.030)
Urine Glucose: NEGATIVE
Urobilinogen, UA: 0.2 (ref 0.0–1.0)
pH: 6 (ref 5.0–8.0)

## 2019-08-18 LAB — HEPATIC FUNCTION PANEL
ALT: 13 U/L (ref 0–35)
AST: 18 U/L (ref 0–37)
Albumin: 4.4 g/dL (ref 3.5–5.2)
Alkaline Phosphatase: 47 U/L (ref 39–117)
Bilirubin, Direct: 0.1 mg/dL (ref 0.0–0.3)
Total Bilirubin: 0.6 mg/dL (ref 0.2–1.2)
Total Protein: 7.2 g/dL (ref 6.0–8.3)

## 2019-08-18 LAB — CBC WITH DIFFERENTIAL/PLATELET
Basophils Absolute: 0 10*3/uL (ref 0.0–0.1)
Basophils Relative: 0.5 % (ref 0.0–3.0)
Eosinophils Absolute: 0 10*3/uL (ref 0.0–0.7)
Eosinophils Relative: 0.3 % (ref 0.0–5.0)
HCT: 40.4 % (ref 36.0–46.0)
Hemoglobin: 13.6 g/dL (ref 12.0–15.0)
Lymphocytes Relative: 20 % (ref 12.0–46.0)
Lymphs Abs: 1.2 10*3/uL (ref 0.7–4.0)
MCHC: 33.7 g/dL (ref 30.0–36.0)
MCV: 92.2 fl (ref 78.0–100.0)
Monocytes Absolute: 0.6 10*3/uL (ref 0.1–1.0)
Monocytes Relative: 9.4 % (ref 3.0–12.0)
Neutro Abs: 4.3 10*3/uL (ref 1.4–7.7)
Neutrophils Relative %: 69.8 % (ref 43.0–77.0)
Platelets: 229 10*3/uL (ref 150.0–400.0)
RBC: 4.38 Mil/uL (ref 3.87–5.11)
RDW: 13.1 % (ref 11.5–15.5)
WBC: 6.2 10*3/uL (ref 4.0–10.5)

## 2019-08-18 LAB — LIPID PANEL
Cholesterol: 195 mg/dL (ref 0–200)
HDL: 90.5 mg/dL (ref >39.00)
LDL Cholesterol: 93 mg/dL (ref 0–99)
NonHDL: 104.07
Total CHOL/HDL Ratio: 2
Triglycerides: 57 mg/dL (ref 0.0–149.0)
VLDL: 11.4 mg/dL (ref 0.0–40.0)

## 2019-08-18 LAB — TSH: TSH: 1.43 u[IU]/mL (ref 0.35–4.50)

## 2019-08-23 ENCOUNTER — Other Ambulatory Visit: Payer: Self-pay

## 2019-08-23 ENCOUNTER — Encounter: Payer: Self-pay | Admitting: Internal Medicine

## 2019-08-23 ENCOUNTER — Ambulatory Visit (INDEPENDENT_AMBULATORY_CARE_PROVIDER_SITE_OTHER): Payer: Medicare PPO | Admitting: Internal Medicine

## 2019-08-23 VITALS — BP 120/76 | HR 113 | Temp 98.3°F | Ht 63.0 in | Wt 119.0 lb

## 2019-08-23 DIAGNOSIS — Z0000 Encounter for general adult medical examination without abnormal findings: Secondary | ICD-10-CM | POA: Diagnosis not present

## 2019-08-23 NOTE — Progress Notes (Signed)
Subjective:    Patient ID: Erica Patterson, female    DOB: November 10, 1940, 78 y.o.   MRN: [id number]  HPI   Here for wellness and f/u;  Overall doing ok;  Pt denies Chest pain, worsening SOB, DOE, wheezing, orthopnea, PND, worsening LE edema, palpitations, dizziness or syncope.  Pt denies neurological change such as new headache, facial or extremity weakness.  Pt denies polydipsia, polyuria, or low sugar symptoms. Pt states overall good compliance with treatment and medications, good tolerability, and has been trying to follow appropriate diet.  Pt denies worsening depressive symptoms, suicidal ideation or panic. No fever, night sweats, wt loss, loss of appetite, or other constitutional symptoms.  Pt states good ability with ADL's, has low fall risk, home safety reviewed and adequate, no other significant changes in hearing or vision, and only occasionally active with exercise.  Has 3 mile walk daily, goes to [employer] every 6 wks  But mostly stays home.  Due for second shingles shot dec 4.   Past Medical History:  Diagnosis Date  . CHICKENPOX, HX OF 06/12/2007  . HEPATITIS NOS 06/12/2007  . HYPERLIPIDEMIA 06/12/2007  . OSTEOARTHRITIS 06/12/2007  . OSTEOPENIA 05/24/2008  . SKIN LESION 07/03/2009  . UNSPECIFIED SUDDEN HEARING LOSS 07/03/2009   Past Surgical History:  Procedure Laterality Date  . ABDOMINAL HYSTERECTOMY    . breast biopsy    . BREAST EXCISIONAL BIOPSY Bilateral    benign  . OOPHORECTOMY     1 ovary  . TONSILLECTOMY      reports that she has never smoked. She has never used smokeless tobacco. She reports that she does not drink alcohol. No history on file for drug. family history includes Breast cancer in her cousin, cousin, cousin, and cousin; Coronary artery disease in an other family member; Diabetes in an other family member; Hypertension in an other family member. Allergies  Allergen Reactions  . Alendronate Sodium    Current Outpatient Medications on File Prior to Visit   Medication Sig Dispense Refill  . aspirin 81 MG tablet Take 81 mg by mouth daily.      . beta carotene w/minerals (OCUVITE) tablet Take 1 tablet by mouth daily.    . Calcium Carbonate-Vitamin D (CALCIUM + D PO) Take by mouth 2 (two) times daily.       No current facility-administered medications on file prior to visit.    Review of Systems Constitutional: Negative for other unusual diaphoresis, sweats, appetite or weight changes HENT: Negative for other worsening hearing loss, ear pain, facial swelling, mouth sores or neck stiffness.   Eyes: Negative for other worsening pain, redness or other visual disturbance.  Respiratory: Negative for other stridor or swelling Cardiovascular: Negative for other palpitations or other chest pain  Gastrointestinal: Negative for worsening diarrhea or loose stools, blood in stool, distention or other pain Genitourinary: Negative for hematuria, flank pain or other change in urine volume.  Musculoskeletal: Negative for myalgias or other joint swelling.  Skin: Negative for other color change, or other wound or worsening drainage.  Neurological: Negative for other syncope or numbness. Hematological: Negative for other adenopathy or swelling Psychiatric/Behavioral: Negative for hallucinations, other worsening agitation, SI, self-injury, or new decreased concentration All otherwise neg per pt     Objective:   Physical Exam BP 120/76   Pulse (!) 113   Temp 98.3 F (36.8 C) (Oral)   Ht 5\' 3"  (1.6 m)   Wt 119 lb (54 kg)   SpO2 97%   BMI 21.08  kg/m  VS noted,  Constitutional: Pt is oriented to person, place, and time. Appears well-developed and well-nourished, in no significant distress and comfortable Head: Normocephalic and atraumatic  Eyes: Conjunctivae and EOM are normal. Pupils are equal, round, and reactive to light Right Ear: External ear normal without discharge Left Ear: External ear normal without discharge Nose: Nose without discharge or  deformity Mouth/Throat: Oropharynx is without other ulcerations and moist  Neck: Normal range of motion. Neck supple. No JVD present. No tracheal deviation present or significant neck LA or mass Cardiovascular: Normal rate, regular rhythm, normal heart sounds and intact distal pulses.   Pulmonary/Chest: WOB normal and breath sounds without rales or wheezing  Abdominal: Soft. Bowel sounds are normal. NT. No HSM  Musculoskeletal: Normal range of motion. Exhibits no edema Lymphadenopathy: Has no other cervical adenopathy.  Neurological: Pt is alert and oriented to person, place, and time. Pt has normal reflexes. No cranial nerve deficit. Motor grossly intact, Gait intact Skin: Skin is warm and dry. No rash noted or new ulcerations Psychiatric:  Has normal mood and affect. Behavior is normal without agitation All otherwise neg per pt Lab Results  Component Value Date   WBC 6.2 08/18/2019   HGB 13.6 08/18/2019   HCT 40.4 08/18/2019   PLT 229.0 08/18/2019   GLUCOSE 94 08/18/2019   CHOL 195 08/18/2019   TRIG 57.0 08/18/2019   HDL 90.50 08/18/2019   LDLDIRECT 110.2 06/30/2013   LDLCALC 93 08/18/2019   ALT 13 08/18/2019   AST 18 08/18/2019   NA 136 08/18/2019   K 3.6 08/18/2019   CL 100 08/18/2019   CREATININE 0.70 08/18/2019   BUN 10 08/18/2019   CO2 24 08/18/2019   TSH 1.43 08/18/2019   HGBA1C 5.7 08/05/2016   MICROALBUR <0.7 08/05/2016      Assessment & Plan:

## 2019-08-23 NOTE — Patient Instructions (Signed)
Please continue all other medications as before, and refills have been done if requested.  Please have the pharmacy call with any other refills you may need.  Please continue your efforts at being more active, low cholesterol diet, and weight control.  You are otherwise up to date with prevention measures today.  Please keep your appointments with your specialists as you may have planned  Please return in 1 year for your yearly visit, or sooner if needed, with Lab testing done 3-5 days before  

## 2019-08-27 ENCOUNTER — Encounter: Payer: Self-pay | Admitting: Internal Medicine

## 2019-08-27 NOTE — Assessment & Plan Note (Signed)

## 2019-09-19 ENCOUNTER — Telehealth: Payer: Self-pay | Admitting: Internal Medicine

## 2019-09-19 NOTE — Telephone Encounter (Signed)
Will run benefits at the first of the year and will call patient to schedule once those are back. Left message for patient informing.

## 2019-09-19 NOTE — Telephone Encounter (Signed)
Patient is calling to schedule her projalia injection the first of the year. Please advise [phone]

## 2019-11-07 ENCOUNTER — Other Ambulatory Visit: Payer: Self-pay | Admitting: Internal Medicine

## 2019-11-07 DIAGNOSIS — Z1231 Encounter for screening mammogram for malignant neoplasm of breast: Secondary | ICD-10-CM

## 2019-11-23 ENCOUNTER — Ambulatory Visit (INDEPENDENT_AMBULATORY_CARE_PROVIDER_SITE_OTHER): Payer: Medicare PPO | Admitting: *Deleted

## 2019-11-23 ENCOUNTER — Other Ambulatory Visit: Payer: Self-pay

## 2019-11-23 DIAGNOSIS — M810 Age-related osteoporosis without current pathological fracture: Secondary | ICD-10-CM

## 2019-11-23 MED ORDER — DENOSUMAB 60 MG/ML ~~LOC~~ SOSY
60.0000 mg | PREFILLED_SYRINGE | Freq: Once | SUBCUTANEOUS | Status: AC
  Administered 2019-11-23: 60 mg via SUBCUTANEOUS

## 2019-11-23 NOTE — Progress Notes (Signed)
Medical screening examination/treatment/procedure(s) were performed by non-physician practitioner and as supervising physician I was immediately available for consultation/collaboration. I agree with above. Leroy Trim, MD   

## 2019-12-14 DIAGNOSIS — H547 Unspecified visual loss: Secondary | ICD-10-CM | POA: Diagnosis not present

## 2019-12-14 DIAGNOSIS — Z79899 Other long term (current) drug therapy: Secondary | ICD-10-CM | POA: Diagnosis not present

## 2019-12-14 DIAGNOSIS — H3530 Unspecified macular degeneration: Secondary | ICD-10-CM | POA: Diagnosis not present

## 2019-12-14 DIAGNOSIS — R030 Elevated blood-pressure reading, without diagnosis of hypertension: Secondary | ICD-10-CM | POA: Diagnosis not present

## 2019-12-14 DIAGNOSIS — Z823 Family history of stroke: Secondary | ICD-10-CM | POA: Diagnosis not present

## 2019-12-14 DIAGNOSIS — Z7722 Contact with and (suspected) exposure to environmental tobacco smoke (acute) (chronic): Secondary | ICD-10-CM | POA: Diagnosis not present

## 2019-12-14 DIAGNOSIS — M810 Age-related osteoporosis without current pathological fracture: Secondary | ICD-10-CM | POA: Diagnosis not present

## 2019-12-14 DIAGNOSIS — Z7982 Long term (current) use of aspirin: Secondary | ICD-10-CM | POA: Diagnosis not present

## 2019-12-14 DIAGNOSIS — H269 Unspecified cataract: Secondary | ICD-10-CM | POA: Diagnosis not present

## 2019-12-16 ENCOUNTER — Ambulatory Visit: Payer: Medicare PPO

## 2020-01-05 ENCOUNTER — Other Ambulatory Visit: Payer: Self-pay

## 2020-01-05 ENCOUNTER — Ambulatory Visit
Admission: RE | Admit: 2020-01-05 | Discharge: 2020-01-05 | Disposition: A | Discharge status: Home / Self Care | Payer: Medicare PPO | Source: Ambulatory Visit | Attending: Internal Medicine | Admitting: Internal Medicine

## 2020-01-05 DIAGNOSIS — Z1231 Encounter for screening mammogram for malignant neoplasm of breast: Secondary | ICD-10-CM | POA: Diagnosis not present

## 2020-07-16 ENCOUNTER — Other Ambulatory Visit: Payer: Self-pay

## 2020-07-16 ENCOUNTER — Ambulatory Visit (INDEPENDENT_AMBULATORY_CARE_PROVIDER_SITE_OTHER): Payer: Medicare PPO | Admitting: *Deleted

## 2020-07-16 DIAGNOSIS — M810 Age-related osteoporosis without current pathological fracture: Secondary | ICD-10-CM | POA: Diagnosis not present

## 2020-07-16 MED ORDER — DENOSUMAB 60 MG/ML ~~LOC~~ SOSY
60.0000 mg | PREFILLED_SYRINGE | Freq: Once | SUBCUTANEOUS | Status: AC
  Administered 2020-07-16: 60 mg via SUBCUTANEOUS

## 2020-07-16 NOTE — Progress Notes (Signed)
Pls cosign for prolia inj../lmb 

## 2020-07-27 DIAGNOSIS — H47321 Drusen of optic disc, right eye: Secondary | ICD-10-CM | POA: Diagnosis not present

## 2020-07-27 DIAGNOSIS — H5203 Hypermetropia, bilateral: Secondary | ICD-10-CM | POA: Diagnosis not present

## 2020-07-27 DIAGNOSIS — H2513 Age-related nuclear cataract, bilateral: Secondary | ICD-10-CM | POA: Diagnosis not present

## 2020-07-27 DIAGNOSIS — H5340 Unspecified visual field defects: Secondary | ICD-10-CM | POA: Diagnosis not present

## 2020-08-22 ENCOUNTER — Encounter: Payer: Medicare PPO | Admitting: Internal Medicine

## 2020-08-27 ENCOUNTER — Encounter: Payer: Medicare PPO | Admitting: Internal Medicine

## 2020-08-31 ENCOUNTER — Other Ambulatory Visit (INDEPENDENT_AMBULATORY_CARE_PROVIDER_SITE_OTHER): Payer: Medicare PPO

## 2020-08-31 DIAGNOSIS — Z0000 Encounter for general adult medical examination without abnormal findings: Secondary | ICD-10-CM

## 2020-08-31 LAB — HEPATIC FUNCTION PANEL
ALT: 15 U/L (ref 0–35)
AST: 20 U/L (ref 0–37)
Albumin: 4.6 g/dL (ref 3.5–5.2)
Alkaline Phosphatase: 45 U/L (ref 39–117)
Bilirubin, Direct: 0.1 mg/dL (ref 0.0–0.3)
Total Bilirubin: 0.6 mg/dL (ref 0.2–1.2)
Total Protein: 7.8 g/dL (ref 6.0–8.3)

## 2020-08-31 LAB — BASIC METABOLIC PANEL
BUN: 12 mg/dL (ref 6–23)
CO2: 26 mEq/L (ref 19–32)
Calcium: 9.3 mg/dL (ref 8.4–10.5)
Chloride: 98 mEq/L (ref 96–112)
Creatinine, Ser: 0.76 mg/dL (ref 0.40–1.20)
GFR: 74.34 mL/min (ref >60.00)
Glucose, Bld: 96 mg/dL (ref 70–99)
Potassium: 3.7 mEq/L (ref 3.5–5.1)
Sodium: 136 mEq/L (ref 135–145)

## 2020-08-31 LAB — URINALYSIS, ROUTINE W REFLEX MICROSCOPIC
Bilirubin Urine: NEGATIVE
Ketones, ur: 40 — AB
Nitrite: NEGATIVE
Specific Gravity, Urine: 1.02 (ref 1.000–1.030)
Total Protein, Urine: NEGATIVE
Urine Glucose: NEGATIVE
Urobilinogen, UA: 0.2 (ref 0.0–1.0)
pH: 6 (ref 5.0–8.0)

## 2020-08-31 LAB — TSH: TSH: 1.2 u[IU]/mL (ref 0.35–4.50)

## 2020-08-31 LAB — CBC WITH DIFFERENTIAL/PLATELET
Basophils Absolute: 0.1 10*3/uL (ref 0.0–0.1)
Basophils Relative: 0.6 % (ref 0.0–3.0)
Eosinophils Absolute: 0 10*3/uL (ref 0.0–0.7)
Eosinophils Relative: 0.1 % (ref 0.0–5.0)
HCT: 42.9 % (ref 36.0–46.0)
Hemoglobin: 14.5 g/dL (ref 12.0–15.0)
Lymphocytes Relative: 18.8 % (ref 12.0–46.0)
Lymphs Abs: 1.5 10*3/uL (ref 0.7–4.0)
MCHC: 33.8 g/dL (ref 30.0–36.0)
MCV: 91.4 fl (ref 78.0–100.0)
Monocytes Absolute: 0.7 10*3/uL (ref 0.1–1.0)
Monocytes Relative: 8.4 % (ref 3.0–12.0)
Neutro Abs: 5.9 10*3/uL (ref 1.4–7.7)
Neutrophils Relative %: 72.1 % (ref 43.0–77.0)
Platelets: 238 10*3/uL (ref 150.0–400.0)
RBC: 4.7 Mil/uL (ref 3.87–5.11)
RDW: 13.1 % (ref 11.5–15.5)
WBC: 8.2 10*3/uL (ref 4.0–10.5)

## 2020-08-31 LAB — LIPID PANEL
Cholesterol: 201 mg/dL — ABNORMAL HIGH (ref 0–200)
HDL: 94.6 mg/dL (ref >39.00)
LDL Cholesterol: 93 mg/dL (ref 0–99)
NonHDL: 105.97
Total CHOL/HDL Ratio: 2
Triglycerides: 64 mg/dL (ref 0.0–149.0)
VLDL: 12.8 mg/dL (ref 0.0–40.0)

## 2020-09-04 ENCOUNTER — Other Ambulatory Visit: Payer: Self-pay

## 2020-09-04 ENCOUNTER — Encounter: Payer: Self-pay | Admitting: Internal Medicine

## 2020-09-04 ENCOUNTER — Ambulatory Visit (INDEPENDENT_AMBULATORY_CARE_PROVIDER_SITE_OTHER): Payer: Medicare PPO | Admitting: Internal Medicine

## 2020-09-04 VITALS — BP 120/80 | HR 79 | Temp 98.1°F | Ht 63.0 in | Wt 120.0 lb

## 2020-09-04 DIAGNOSIS — Z0000 Encounter for general adult medical examination without abnormal findings: Secondary | ICD-10-CM | POA: Diagnosis not present

## 2020-09-04 DIAGNOSIS — M159 Polyosteoarthritis, unspecified: Secondary | ICD-10-CM

## 2020-09-04 DIAGNOSIS — F419 Anxiety disorder, unspecified: Secondary | ICD-10-CM

## 2020-09-04 DIAGNOSIS — H9192 Unspecified hearing loss, left ear: Secondary | ICD-10-CM

## 2020-09-04 DIAGNOSIS — E7849 Other hyperlipidemia: Secondary | ICD-10-CM

## 2020-09-04 DIAGNOSIS — Z1159 Encounter for screening for other viral diseases: Secondary | ICD-10-CM | POA: Diagnosis not present

## 2020-09-04 DIAGNOSIS — F32A Depression, unspecified: Secondary | ICD-10-CM | POA: Diagnosis not present

## 2020-09-04 DIAGNOSIS — Z0001 Encounter for general adult medical examination with abnormal findings: Secondary | ICD-10-CM

## 2020-09-04 NOTE — Progress Notes (Addendum)
Subjective:    Patient ID: Erica Patterson, female    DOB: 05/22/1941, 79 y.o.   MRN: [id number]  HPI  Here for wellness and f/u;  Overall doing ok;  Pt denies Chest pain, worsening SOB, DOE, wheezing, orthopnea, PND, worsening LE edema, palpitations, dizziness or syncope.  Pt denies neurological change such as new headache, facial or extremity weakness.  Pt denies polydipsia, polyuria, or low sugar symptoms. Pt states overall good compliance with treatment and medications, good tolerability, and has been trying to follow appropriate diet.  Pt denies worsening depressive symptoms, suicidal ideation or panic. No fever, night sweats, wt loss, loss of appetite, or other constitutional symptoms.  Pt states good ability with ADL's, has low fall risk, home safety reviewed and adequate, no other significant changes in hearing or vision, and only occasionally active with exercise. Has bilateral hearing aids, uses then though she does not like them and far sounds seem the same volume asa nearer sounds; asks for audiology referral.   Also has bilateral worsening knee pain now mod to severe to initially stand up, but can still walk 3 miles per day Past Medical History:  Diagnosis Date  . CHICKENPOX, HX OF 06/12/2007  . HEPATITIS NOS 06/12/2007  . HYPERLIPIDEMIA 06/12/2007  . OSTEOARTHRITIS 06/12/2007  . OSTEOPENIA 05/24/2008  . SKIN LESION 07/03/2009  . UNSPECIFIED SUDDEN HEARING LOSS 07/03/2009   Past Surgical History:  Procedure Laterality Date  . ABDOMINAL HYSTERECTOMY    . breast biopsy    . BREAST EXCISIONAL BIOPSY Bilateral    benign  . OOPHORECTOMY     1 ovary  . TONSILLECTOMY      reports that she has never smoked. She has never used smokeless tobacco. She reports that she does not drink alcohol. No history on file for drug use. family history includes Breast cancer in her cousin, cousin, cousin, and cousin; Coronary artery disease in an other family member; Diabetes in an other family member;  Hypertension in an other family member. Allergies  Allergen Reactions  . Alendronate Sodium    Current Outpatient Medications on File Prior to Visit  Medication Sig Dispense Refill  . aspirin 81 MG tablet Take 81 mg by mouth daily.      . beta carotene w/minerals (OCUVITE) tablet Take 1 tablet by mouth daily.    . Calcium Carbonate-Vitamin D (CALCIUM + D PO) Take by mouth 2 (two) times daily.       No current facility-administered medications on file prior to visit.   Review of Systems All otherwise neg per pt     Objective:   Physical Exam BP 120/80 (BP Location: Left Arm, Patient Position: Sitting, Cuff Size: Large)   Pulse 79   Temp 98.1 F (36.7 C) (Oral)   Ht 5\' 3"  (1.6 m)   Wt 120 lb (54.4 kg)   SpO2 97%   BMI 21.26 kg/m  VS noted,  Constitutional: Pt appears in NAD HENT: Head: NCAT.  Right Ear: External ear normal.  Left Ear: External ear normal.  Eyes: . Pupils are equal, round, and reactive to light. Conjunctivae and EOM are normal Nose: without d/c or deformity Neck: Neck supple. Gross normal ROM Cardiovascular: Normal rate and regular rhythm.   Pulmonary/Chest: Effort normal and breath sounds without rales or wheezing.  Abd:  Soft, NT, ND, + BS, no organomegaly Neurological: Pt is alert. At baseline orientation, motor grossly intact Skin: Skin is warm. No rashes, other new lesions, no LE edema Psychiatric: Pt  behavior is normal without agitation  All otherwise neg per pt Lab Results  Component Value Date   WBC 8.2 08/31/2020   HGB 14.5 08/31/2020   HCT 42.9 08/31/2020   PLT 238.0 08/31/2020   GLUCOSE 96 08/31/2020   CHOL 201 (H) 08/31/2020   TRIG 64.0 08/31/2020   HDL 94.60 08/31/2020   LDLDIRECT 110.2 06/30/2013   LDLCALC 93 08/31/2020   ALT 15 08/31/2020   AST 20 08/31/2020   NA 136 08/31/2020   K 3.7 08/31/2020   CL 98 08/31/2020   CREATININE 0.76 08/31/2020   BUN 12 08/31/2020   CO2 26 08/31/2020   TSH 1.20 08/31/2020   HGBA1C 5.7  08/05/2016   MICROALBUR <0.7 08/05/2016      Assessment & Plan:

## 2020-09-04 NOTE — Patient Instructions (Signed)
Please continue all other medications as before, and refills have been done if requested.  Please have the pharmacy call with any other refills you may need.  Please continue your efforts at being more active, low cholesterol diet, and weight control.  You are otherwise up to date with prevention measures today.  Please keep your appointments with your specialists as you may have planned  Please make an Appointment to return for your 1 year visit, or sooner if needed 

## 2020-09-08 ENCOUNTER — Encounter: Payer: Self-pay | Admitting: Internal Medicine

## 2020-09-08 NOTE — Assessment & Plan Note (Signed)

## 2020-09-08 NOTE — Assessment & Plan Note (Addendum)
To refer audiology  I spent 31 minutes in addition to time for CPX wellness examination in preparing to see the patient by review of recent labs, imaging and procedures, obtaining and reviewing separately obtained history, communicating with the patient and family or caregiver, ordering medications, tests or procedures, and documenting clinical information in the EHR including the differential Dx, treatment, and any further evaluation and other management of hearing loss, DJD, hld, anxiety/depression

## 2020-09-08 NOTE — Assessment & Plan Note (Signed)
stable overall by history and exam, recent data reviewed with pt, and pt to continue medical treatment as before,  to f/u any worsening symptoms or concerns  

## 2020-09-08 NOTE — Assessment & Plan Note (Signed)
With worsening knee oa, declines ortho or sport med referral for now

## 2020-09-24 DIAGNOSIS — H903 Sensorineural hearing loss, bilateral: Secondary | ICD-10-CM | POA: Diagnosis not present

## 2020-11-27 ENCOUNTER — Telehealth: Payer: Self-pay | Admitting: Internal Medicine

## 2020-11-27 DIAGNOSIS — M25561 Pain in right knee: Secondary | ICD-10-CM

## 2020-11-27 DIAGNOSIS — M159 Polyosteoarthritis, unspecified: Secondary | ICD-10-CM

## 2020-11-27 DIAGNOSIS — G8929 Other chronic pain: Secondary | ICD-10-CM

## 2020-11-27 NOTE — Telephone Encounter (Signed)
Patient called and was wondering if a referral to sports medicine could be placed for her knee and joint pain. Her last OV was 12.7.21. She can be reached at [phone]. Please advise

## 2020-11-28 NOTE — Telephone Encounter (Signed)
Ok this is done 

## 2020-12-03 ENCOUNTER — Other Ambulatory Visit: Payer: Self-pay | Admitting: Internal Medicine

## 2020-12-03 DIAGNOSIS — Z1231 Encounter for screening mammogram for malignant neoplasm of breast: Secondary | ICD-10-CM

## 2020-12-07 NOTE — Progress Notes (Signed)
    Subjective:    CC: B Knee pain, L>R  I, Molly Weber, LAT, ATC, am serving as scribe for Dr. [name].  HPI: Pt is an 80 y/o female presenting w/ c/o chronic knee pain and generalized joint pain. She notes that she injured her L knee as an adolescent and has had issues w/ that knee ever since. She locates her knee pain to her B medial knees.  She had a fall in 2013 and damaged her L meniscus for which she had "laser surgery."  She also reports generalized joint pain.  Patient would like to maximize conservative management before proceeding with interventional treatment if possible.  She lives in [place] and is willing to proceed with physical therapy.  Knee swelling: intermittently in the L knee Knee mechanical symptoms: No Aggravating factors: weight-bearing activity; transitioning from sitting to standing; Treatments tried: IBU; salonpas  Diagnostic imaging: L knee XR- 09/27/12  Pertinent review of Systems: No fevers or chills  Relevant historical information: Migraine headache.  Hyperlipidemia   Objective:    Vitals:   12/10/20 1112  BP: 102/68  Pulse: 78  SpO2: 97%   General: Well Developed, well nourished, and in no acute distress.   MSK: Bilateral knees normal-appearing normal motion with crepitation.  Tender palpation medial joint line. Stable ligamentous exam. Positive McMurray's test left negative right Intact strength.  Lab and Radiology Results  Diagnostic Limited MSK Ultrasound of: Right knee Tendon intact normal-appearing Trace joint effusion. Patellar tendon intact normal appearing. Degenerative medial joint line.  Normal lateral joint line. Impression: Mild DJD  Diagnostic Limited MSK Ultrasound of: Left knee Quad tendon intact normal. Trace joint effusion. Patellar tendon intact normal-appearing Medial joint line degenerative medial meniscus. Lateral joint line normal-appearing Impression: Mild DJD.  Medial meniscus  degeneration   Impression and Recommendations:    Assessment and Plan: 80 y.o. female with bilateral knee pain left worse than right due to degeneration.  Discussed options.  Patient would like to maximize conservative management.  Plan for Voltaren gel for and physical therapy to work on quad strengthening.  Recheck back in about 6 weeks.  Consider steroid injection and x-ray if not improved.Marland Kitchen  PDMP not reviewed this encounter. Orders Placed This Encounter  Procedures  . [place] LIMITED JOINT SPACE STRUCTURES LOW BILAT(NO LINKED CHARGES)    Order Specific Question:   Reason for Exam (SYMPTOM  OR DIAGNOSIS REQUIRED)    Answer:   eval knee pain    Order Specific Question:   Preferred imaging location?    Answer:   [facility]  . Ambulatory referral to Physical Therapy    Referral Priority:   Routine    Referral Type:   Physical Medicine    Referral Reason:   Specialty Services Required    Requested Specialty:   Physical Therapy    Number of Visits Requested:   1   No orders of the defined types were placed in this encounter.   Discussed warning signs or symptoms. Please see discharge instructions. Patient expresses understanding.   The above documentation has been reviewed and is accurate and complete [name], M.D.

## 2020-12-10 ENCOUNTER — Other Ambulatory Visit: Payer: Self-pay

## 2020-12-10 ENCOUNTER — Ambulatory Visit: Payer: Self-pay

## 2020-12-10 ENCOUNTER — Ambulatory Visit: Payer: Medicare PPO | Admitting: Family Medicine

## 2020-12-10 ENCOUNTER — Encounter: Payer: Self-pay | Admitting: Family Medicine

## 2020-12-10 VITALS — BP 102/68 | HR 78 | Ht 63.0 in | Wt 125.2 lb

## 2020-12-10 DIAGNOSIS — G8929 Other chronic pain: Secondary | ICD-10-CM | POA: Diagnosis not present

## 2020-12-10 DIAGNOSIS — M25562 Pain in left knee: Secondary | ICD-10-CM

## 2020-12-10 DIAGNOSIS — M25561 Pain in right knee: Secondary | ICD-10-CM | POA: Diagnosis not present

## 2020-12-10 NOTE — Patient Instructions (Signed)
Thank you for coming in today.  Please use voltaren gel up to 4x daily for pain as needed.   I've referred you to Physical Therapy.  Let us know if you don't hear from them in one week.  Recheck in about 6 weeks.   If not improved consider cortisone injection in the knees.   Can do that sooner if needed.   Stay active.

## 2020-12-19 DIAGNOSIS — M25562 Pain in left knee: Secondary | ICD-10-CM | POA: Diagnosis not present

## 2020-12-19 DIAGNOSIS — M25561 Pain in right knee: Secondary | ICD-10-CM | POA: Diagnosis not present

## 2020-12-26 DIAGNOSIS — M25562 Pain in left knee: Secondary | ICD-10-CM | POA: Diagnosis not present

## 2020-12-26 DIAGNOSIS — M25561 Pain in right knee: Secondary | ICD-10-CM | POA: Diagnosis not present

## 2020-12-28 DIAGNOSIS — M25562 Pain in left knee: Secondary | ICD-10-CM | POA: Diagnosis not present

## 2020-12-28 DIAGNOSIS — M25561 Pain in right knee: Secondary | ICD-10-CM | POA: Diagnosis not present

## 2020-12-31 NOTE — Progress Notes (Signed)
I, Erica Patterson, LAT, ATC, am serving as scribe for Dr. [name].  Erica Patterson is a 80 y.o. female who presents to [facility] today for f/u of B knee pain and B knee injections.  She was last seen by Dr. [name] on 12/10/20 and was referred to PT at [facility] x 4-5 visits.  Since her last visit, pt reports R knee is extremely painful. Pt has compliant w/ HEP. Pt is still not able to go for a walk. Pt reports last night she couldn't get comfortable to sleep.    Pertinent review of systems: No fevers or chills  Relevant historical information: Hyperlipidemia   Exam:  BP 106/70 (BP Location: Right Arm, Patient Position: Sitting, Cuff Size: Normal)   Pulse 79   Ht 5\' 3"  (1.6 m)   Wt 123 lb 12.8 oz (56.2 kg)   SpO2 98%   BMI 21.93 kg/m  General: Well Developed, well nourished, and in no acute distress.   MSK: Knees bilaterally mild effusion normal motion with crepitation.  Tender palpation medial joint line.    Lab and Radiology Results  Procedure: Real-time Ultrasound Guided Injection of right knee superior lateral patellar space Device: Philips Affiniti 50G Images permanently stored and available for review in PACS Verbal informed consent obtained.  Discussed risks and benefits of procedure. Warned about infection bleeding damage to structures skin hypopigmentation and fat atrophy among others. Patient expresses understanding and agreement Time-out conducted.   Noted no overlying erythema, induration, or other signs of local infection.   Skin prepped in a sterile fashion.   Local anesthesia: Topical Ethyl chloride.   With sterile technique and under real time ultrasound guidance:  40 mg of Kenalog and 2 mL of Marcaine injected into knee joint. Fluid seen entering the joint capsule.   Completed without difficulty   Pain immediately resolved suggesting accurate placement of the medication.   Advised to call if fevers/chills, erythema,  induration, drainage, or persistent bleeding.   Images permanently stored and available for review in the ultrasound unit.  Impression: Technically successful ultrasound guided injection.    Procedure: Real-time Ultrasound Guided Injection of left knee superior lateral patellar space Device: Philips Affiniti 50G Images permanently stored and available for review in PACS Verbal informed consent obtained.  Discussed risks and benefits of procedure. Warned about infection bleeding damage to structures skin hypopigmentation and fat atrophy among others. Patient expresses understanding and agreement Time-out conducted.   Noted no overlying erythema, induration, or other signs of local infection.   Skin prepped in a sterile fashion.   Local anesthesia: Topical Ethyl chloride.   With sterile technique and under real time ultrasound guidance:  40 mg of Kenalog and 2 mL of Marcaine injected into knee joint. Fluid seen entering the joint capsule.   Completed without difficulty   Pain immediately resolved suggesting accurate placement of the medication.   Advised to call if fevers/chills, erythema, induration, drainage, or persistent bleeding.   Images permanently stored and available for review in the ultrasound unit.  Impression: Technically successful ultrasound guided injection.    X-ray images bilateral knees obtained today personally and independently interpreted  Right knee: Moderate patellofemoral DJD.  Mild medial lateral DJD.  No fractures visible.  Left knee: Severe patellofemoral DJD.  Mild medial and lateral DJD.  No fractures.  Await formal radiology review      Assessment and Plan: 80 y.o. female with bilateral knee pain thought to be due  to DJD.  Plan for steroid injection today.  Recheck back in a month or 6 weeks or as needed.   PDMP not reviewed this encounter. Orders Placed This Encounter  Procedures  . DG Knee AP/LAT W/Sunrise Right    Standing Status:   Future     Number of Occurrences:   1    Standing Expiration Date:   01/01/2022    Order Specific Question:   Reason for Exam (SYMPTOM  OR DIAGNOSIS REQUIRED)    Answer:   knee pain    Order Specific Question:   Preferred imaging location?    Answer:   [name]  . DG Knee AP/LAT W/Sunrise Left    Standing Status:   Future    Number of Occurrences:   1    Standing Expiration Date:   01/01/2022    Order Specific Question:   Reason for Exam (SYMPTOM  OR DIAGNOSIS REQUIRED)    Answer:   knee pain    Order Specific Question:   Preferred imaging location?    Answer:   [name]  . [place] LIMITED JOINT SPACE STRUCTURES LOW BILAT(NO LINKED CHARGES)    Order Specific Question:   Reason for Exam (SYMPTOM  OR DIAGNOSIS REQUIRED)    Answer:   bl knee pain    Order Specific Question:   Preferred imaging location?    Answer:   [facility]   No orders of the defined types were placed in this encounter.    Discussed warning signs or symptoms. Please see discharge instructions. Patient expresses understanding.   The above documentation has been reviewed and is accurate and complete [name], M.D.

## 2021-01-01 ENCOUNTER — Ambulatory Visit: Payer: Medicare PPO | Admitting: Family Medicine

## 2021-01-01 ENCOUNTER — Ambulatory Visit (INDEPENDENT_AMBULATORY_CARE_PROVIDER_SITE_OTHER): Payer: Medicare PPO

## 2021-01-01 ENCOUNTER — Ambulatory Visit: Payer: Self-pay

## 2021-01-01 ENCOUNTER — Other Ambulatory Visit: Payer: Self-pay

## 2021-01-01 VITALS — BP 106/70 | HR 79 | Ht 63.0 in | Wt 123.8 lb

## 2021-01-01 DIAGNOSIS — G8929 Other chronic pain: Secondary | ICD-10-CM

## 2021-01-01 DIAGNOSIS — M25562 Pain in left knee: Secondary | ICD-10-CM | POA: Diagnosis not present

## 2021-01-01 DIAGNOSIS — M1712 Unilateral primary osteoarthritis, left knee: Secondary | ICD-10-CM | POA: Diagnosis not present

## 2021-01-01 DIAGNOSIS — M25561 Pain in right knee: Secondary | ICD-10-CM

## 2021-01-01 DIAGNOSIS — M1711 Unilateral primary osteoarthritis, right knee: Secondary | ICD-10-CM | POA: Diagnosis not present

## 2021-01-01 NOTE — Patient Instructions (Addendum)
Thank you for coming in today.   Please get an Xray today before you leave   Call or go to the ER if you develop a large red swollen joint with extreme pain or oozing puss.    Recheck as needed.  

## 2021-01-02 NOTE — Progress Notes (Signed)
X-ray knee shows arthritis.  They can also see where you had an injection.

## 2021-01-02 NOTE — Progress Notes (Signed)
X-ray knee shows arthritis

## 2021-01-18 NOTE — Progress Notes (Signed)
   I, [name], LAT, ATC, am serving as scribe for Dr. [name].  Erica Patterson is a 80 y.o. female who presents to [facility] at [facility] today for f/u of B knee pain.  She was last seen by Dr. [name] on 01/01/21 and had B knee injections.  She has had a course of PT at [facility] w/ little improvement noted.  Since her last visit, pt reports that the knee injections helped quite a bit.  She has resumed walking but is now only doing every other day and is doing 2.5 miles instead of her prior 3 miles.  She wants to ask questions about gel injections or "stem" cell injections.  Diagnostic testing: R and L knee XR- 01/01/21  Pertinent review of systems: No fevers or chills  Relevant historical information: Osteoporosis   Exam:  BP 130/70 (BP Location: Right Arm, Patient Position: Sitting, Cuff Size: Normal)   Pulse 87   Ht 5\' 3"  (1.6 m)   Wt 123 lb 6.4 oz (56 kg)   SpO2 97%   BMI 21.86 kg/m  General: Well Developed, well nourished, and in no acute distress.   MSK: Knees bilaterally normal motion normal gait    Lab and Radiology Results  EXAM: LEFT KNEE 3 VIEWS  COMPARISON:  Left knee radiograph September 27, 2012.  FINDINGS: No evidence of fracture, dislocation, or joint effusion. Tricompartment degenerative change most prominent in the patellofemoral compartment where it is moderate but unchanged compared to prior. Soft tissues are unremarkable.  IMPRESSION: No acute abnormality.  Similar tricompartment degenerative change, most prominent in the patellofemoral compartment.   Electronically Signed   By: September 29, 2012 MD   On: 01/02/2021 09:41  EXAM: RIGHT KNEE 3 VIEWS  COMPARISON:  None.  FINDINGS: Small lipohemarthrosis. No visualized fracture or dislocation. Mild tricompartment degenerative change. Soft tissues are unremarkable.  IMPRESSION: 1. Small lipohemarthrosis, per patient's epic note patient received a joint  injection yesterday as such the low density area is likely sequela of that intervention. No visualized fracture or dislocation. 2. Mild tricompartment degenerative change.   Electronically Signed   By: 03/04/2021 MD   On: 01/02/2021 09:43  I, 03/04/2021, personally (independently) visualized and performed the interpretation of the images attached in this note.   Assessment and Plan: 80 y.o. female with bilateral knee pain due to osteoarthritis.  Patient had steroid injection and is doing reasonably well but would like to avoid future steroid injections if possible due to concern for side effects.  Discussed future possibilities including hyaluronic acid injections and PRP injection.  At this point in my medical opinion stem cell technology is not mature enough to recommend for osteoarthritis management.  After discussion we will work on authorization for hyaluronic acid injections and proceed with injection when the steroid injection started wearing off in the future.  Recheck as needed.   Discussed warning signs or symptoms. Please see discharge instructions. Patient expresses understanding.   The above documentation has been reviewed and is accurate and complete 96, M.D.  Total encounter time 20 minutes including face-to-face time with the patient and, reviewing past medical record, and charting on the date of service.   Treatment plan and options

## 2021-01-21 ENCOUNTER — Ambulatory Visit: Payer: Medicare PPO | Admitting: Family Medicine

## 2021-01-21 ENCOUNTER — Other Ambulatory Visit: Payer: Self-pay

## 2021-01-21 ENCOUNTER — Encounter: Payer: Self-pay | Admitting: Family Medicine

## 2021-01-21 VITALS — BP 130/70 | HR 87 | Ht 63.0 in | Wt 123.4 lb

## 2021-01-21 DIAGNOSIS — M25561 Pain in right knee: Secondary | ICD-10-CM | POA: Diagnosis not present

## 2021-01-21 DIAGNOSIS — G8929 Other chronic pain: Secondary | ICD-10-CM | POA: Diagnosis not present

## 2021-01-21 DIAGNOSIS — M25562 Pain in left knee: Secondary | ICD-10-CM

## 2021-01-21 DIAGNOSIS — M170 Bilateral primary osteoarthritis of knee: Secondary | ICD-10-CM

## 2021-01-21 NOTE — Patient Instructions (Signed)
Thank you for coming in today.  Plan for gel shots at some point in the future.   I recommend waiting until the knee starts hurting.   Typically the authorization is valid for 6 months.

## 2021-01-25 ENCOUNTER — Other Ambulatory Visit: Payer: Self-pay

## 2021-01-25 ENCOUNTER — Ambulatory Visit
Admission: RE | Admit: 2021-01-25 | Discharge: 2021-01-25 | Disposition: A | Discharge status: Home / Self Care | Payer: Medicare PPO | Source: Ambulatory Visit | Attending: Internal Medicine | Admitting: Internal Medicine

## 2021-01-25 DIAGNOSIS — Z1231 Encounter for screening mammogram for malignant neoplasm of breast: Secondary | ICD-10-CM | POA: Diagnosis not present

## 2021-02-06 ENCOUNTER — Other Ambulatory Visit: Payer: Self-pay

## 2021-02-06 ENCOUNTER — Ambulatory Visit (INDEPENDENT_AMBULATORY_CARE_PROVIDER_SITE_OTHER): Payer: Medicare PPO

## 2021-02-06 DIAGNOSIS — M810 Age-related osteoporosis without current pathological fracture: Secondary | ICD-10-CM

## 2021-02-06 MED ORDER — DENOSUMAB 60 MG/ML ~~LOC~~ SOSY
60.0000 mg | PREFILLED_SYRINGE | Freq: Once | SUBCUTANEOUS | Status: AC
  Administered 2021-02-06: 60 mg via SUBCUTANEOUS

## 2021-02-06 NOTE — Progress Notes (Signed)
Patient came into the office to receive her prolia injection. She tolerated the injection well. No questions or concerns.

## 2021-02-06 NOTE — Progress Notes (Signed)
Patient ID: Erica Patterson, female   DOB: 01/23/1941, 80 y.o.   MRN: [id number]  Medical screening examination/treatment/procedure(s) were performed by non-physician practitioner and as supervising physician I was immediately available for consultation/collaboration. I agree with above. Oliver Barre, MD

## 2021-02-13 ENCOUNTER — Ambulatory Visit (INDEPENDENT_AMBULATORY_CARE_PROVIDER_SITE_OTHER): Payer: Medicare PPO | Admitting: Family Medicine

## 2021-02-13 ENCOUNTER — Other Ambulatory Visit: Payer: Self-pay

## 2021-02-13 ENCOUNTER — Ambulatory Visit: Payer: Self-pay

## 2021-02-13 DIAGNOSIS — G8929 Other chronic pain: Secondary | ICD-10-CM

## 2021-02-13 DIAGNOSIS — M170 Bilateral primary osteoarthritis of knee: Secondary | ICD-10-CM | POA: Diagnosis not present

## 2021-02-13 DIAGNOSIS — M25561 Pain in right knee: Secondary | ICD-10-CM

## 2021-02-13 DIAGNOSIS — M25562 Pain in left knee: Secondary | ICD-10-CM | POA: Diagnosis not present

## 2021-02-13 NOTE — Progress Notes (Signed)
Erica Patterson presents to clinic today for Orthovisc injection bilateral knees 1/3  Procedure: Real-time Ultrasound Guided Injection of right knee superior lateral patellar space Device: Philips Affiniti 50G Images permanently stored and available for review in PACS Verbal informed consent obtained.  Discussed risks and benefits of procedure. Warned about infection bleeding damage to structures skin hypopigmentation and fat atrophy among others. Patient expresses understanding and agreement Time-out conducted.   Noted no overlying erythema, induration, or other signs of local infection.   Skin prepped in a sterile fashion.   Local anesthesia: Topical Ethyl chloride.   With sterile technique and under real time ultrasound guidance:  Orthovisc 30 mg injected into knee joint. Fluid seen entering the joint capsule.   Completed without difficulty   Pain immediately resolved suggesting accurate placement of the medication.   Advised to call if fevers/chills, erythema, induration, drainage, or persistent bleeding.   Images permanently stored and available for review in the ultrasound unit.  Impression: Technically successful ultrasound guided injection.   Procedure: Real-time Ultrasound Guided Injection of left knee superior lateral patellar space Device: Philips Affiniti 50G Images permanently stored and available for review in PACS Verbal informed consent obtained.  Discussed risks and benefits of procedure. Warned about infection bleeding damage to structures skin hypopigmentation and fat atrophy among others. Patient expresses understanding and agreement Time-out conducted.   Noted no overlying erythema, induration, or other signs of local infection.   Skin prepped in a sterile fashion.   Local anesthesia: Topical Ethyl chloride.   With sterile technique and under real time ultrasound guidance:  Orthovisc 30 mg injected into knee joint. Fluid seen entering the joint capsule.   Completed without  difficulty   Pain immediately resolved suggesting accurate placement of the medication.   Advised to call if fevers/chills, erythema, induration, drainage, or persistent bleeding.   Images permanently stored and available for review in the ultrasound unit.  Impression: Technically successful ultrasound guided injection.     Lot [id number] for both injections

## 2021-02-13 NOTE — Patient Instructions (Signed)
You had bilateral knee Orthovisc injections today.  Call or go to the ER if you develop a large red swollen joint with extreme pain or oozing puss.   If you haven't already, please schedule follow-up appointments for the next 2 weeks to get the 2nd and 3rd rounds of your Orthovisc injections.

## 2021-02-20 ENCOUNTER — Other Ambulatory Visit: Payer: Self-pay

## 2021-02-20 ENCOUNTER — Ambulatory Visit: Payer: Self-pay

## 2021-02-20 ENCOUNTER — Ambulatory Visit (INDEPENDENT_AMBULATORY_CARE_PROVIDER_SITE_OTHER): Payer: Medicare PPO | Admitting: Family Medicine

## 2021-02-20 DIAGNOSIS — M25561 Pain in right knee: Secondary | ICD-10-CM | POA: Diagnosis not present

## 2021-02-20 DIAGNOSIS — M25562 Pain in left knee: Secondary | ICD-10-CM

## 2021-02-20 DIAGNOSIS — G8929 Other chronic pain: Secondary | ICD-10-CM | POA: Diagnosis not present

## 2021-02-20 DIAGNOSIS — M170 Bilateral primary osteoarthritis of knee: Secondary | ICD-10-CM

## 2021-02-20 NOTE — Patient Instructions (Addendum)
Thank you for coming in today.  You had gel injections today in both of your knees. Call or go to the ER if you develop a large red swollen joint with extreme pain or oozing puss.   See you next week for the 3rd gel injection.

## 2021-02-20 NOTE — Progress Notes (Signed)
Erica Patterson presents to clinic today for Orthovisc injection bilateral knees 2/3  Procedure: Real-time Ultrasound Guided Injection of right knee superior lateral patellar space Device: Philips Affiniti 50G Images permanently stored and available for review in PACS Verbal informed consent obtained.  Discussed risks and benefits of procedure. Warned about infection bleeding damage to structures skin hypopigmentation and fat atrophy among others. Patient expresses understanding and agreement Time-out conducted.   Noted no overlying erythema, induration, or other signs of local infection.   Skin prepped in a sterile fashion.   Local anesthesia: Topical Ethyl chloride.   With sterile technique and under real time ultrasound guidance:  Orthovisc 30 mg injected into knee joint. Fluid seen entering the joint capsule.   Completed without difficulty   Advised to call if fevers/chills, erythema, induration, drainage, or persistent bleeding.   Images permanently stored and available for review in the ultrasound unit.  Impression: Technically successful ultrasound guided injection.   Procedure: Real-time Ultrasound Guided Injection of left knee superior lateral patellar space Device: Philips Affiniti 50G Images permanently stored and available for review in PACS Verbal informed consent obtained.  Discussed risks and benefits of procedure. Warned about infection bleeding damage to structures skin hypopigmentation and fat atrophy among others. Patient expresses understanding and agreement Time-out conducted.   Noted no overlying erythema, induration, or other signs of local infection.   Skin prepped in a sterile fashion.   Local anesthesia: Topical Ethyl chloride.   With sterile technique and under real time ultrasound guidance:  Orthovisc 30 mg injected into knee joint. Fluid seen entering the joint capsule.   Completed without difficulty     Advised to call if fevers/chills, erythema, induration, drainage,  or persistent bleeding.   Images permanently stored and available for review in the ultrasound unit.  Impression: Technically successful ultrasound guided injection.  Lot number [id number] both injections  Of note right knee is not feeling any better yet.  She is starting to consider knee replacement options.

## 2021-02-27 ENCOUNTER — Ambulatory Visit: Payer: Self-pay

## 2021-02-27 ENCOUNTER — Ambulatory Visit (INDEPENDENT_AMBULATORY_CARE_PROVIDER_SITE_OTHER): Payer: Medicare PPO | Admitting: Family Medicine

## 2021-02-27 ENCOUNTER — Other Ambulatory Visit: Payer: Self-pay

## 2021-02-27 DIAGNOSIS — M170 Bilateral primary osteoarthritis of knee: Secondary | ICD-10-CM | POA: Diagnosis not present

## 2021-02-27 DIAGNOSIS — M25561 Pain in right knee: Secondary | ICD-10-CM | POA: Diagnosis not present

## 2021-02-27 DIAGNOSIS — G8929 Other chronic pain: Secondary | ICD-10-CM

## 2021-02-27 DIAGNOSIS — M25562 Pain in left knee: Secondary | ICD-10-CM | POA: Diagnosis not present

## 2021-02-27 NOTE — Patient Instructions (Signed)
Good to see you today.  You had your 3rd round of B knee Orthovisc injections today.  Call or go to the ER if you develop a large red swollen joint with extreme pain or oozing puss.   Follow-up as needed.

## 2021-02-27 NOTE — Progress Notes (Signed)
Erica Patterson presents to clinic today for Orthovisc injection BL knees 3/3  Procedure: Real-time Ultrasound Guided Injection of right knee superior lateral patellar space Device: Philips Affiniti 50G Images permanently stored and available for review in PACS Verbal informed consent obtained.  Discussed risks and benefits of procedure. Warned about infection bleeding damage to structures skin hypopigmentation and fat atrophy among others. Patient expresses understanding and agreement Time-out conducted.   Noted no overlying erythema, induration, or other signs of local infection.   Skin prepped in a sterile fashion.   Local anesthesia: Topical Ethyl chloride.   With sterile technique and under real time ultrasound guidance:  Orthovisc 30 mg injected into knee joint. Fluid seen entering the joint capsule.   Completed without difficulty   Advised to call if fevers/chills, erythema, induration, drainage, or persistent bleeding.   Images permanently stored and available for review in the ultrasound unit.  Impression: Technically successful ultrasound guided injection.    Procedure: Real-time Ultrasound Guided Injection of left knee superior lateral patellar space Device: Philips Affiniti 50G Images permanently stored and available for review in PACS Verbal informed consent obtained.  Discussed risks and benefits of procedure. Warned about infection bleeding damage to structures skin hypopigmentation and fat atrophy among others. Patient expresses understanding and agreement Time-out conducted.   Noted no overlying erythema, induration, or other signs of local infection.   Skin prepped in a sterile fashion.   Local anesthesia: Topical Ethyl chloride.   With sterile technique and under real time ultrasound guidance:  Orthovisc 30 mg injected into knee joint. Fluid seen entering the joint capsule.   Completed without difficulty   Advised to call if fevers/chills, erythema, induration, drainage, or  persistent bleeding.   Images permanently stored and available for review in the ultrasound unit.  Impression: Technically successful ultrasound guided injection.    Lot number: [id number] both injections.    Patient notes that her left knee is feeling a lot better but the right knee does not feel much better at all.  Advised her to wait a few weeks and if the right knee still does not feel any better let me know and I will refer to orthopedic surgery for total knee replacement surgical consultation.

## 2021-05-31 ENCOUNTER — Ambulatory Visit (INDEPENDENT_AMBULATORY_CARE_PROVIDER_SITE_OTHER): Payer: Medicare PPO

## 2021-05-31 VITALS — Ht 63.0 in | Wt 118.0 lb

## 2021-05-31 DIAGNOSIS — Z0000 Encounter for general adult medical examination without abnormal findings: Secondary | ICD-10-CM

## 2021-05-31 NOTE — Progress Notes (Addendum)
I connected with Erica Patterson today by telephone and verified that I am speaking with the correct person using two identifiers. Location patient: home Location provider: work Persons participating in the virtual visit: patient, provider.   I discussed the limitations, risks, security and privacy concerns of performing an evaluation and management service by telephone and the availability of in person appointments. I also discussed with the patient that there may be a patient responsible charge related to this service. The patient expressed understanding and verbally consented to this telephonic visit.    Interactive audio and video telecommunications were attempted between this provider and patient, however failed, due to patient having technical difficulties OR patient did not have access to video capability.  We continued and completed visit with audio only.  Some vital signs may be absent or patient reported.   Time Spent with patient on telephone encounter: 30 minutes  Subjective:   Erica Patterson is a 80 y.o. female who presents for Medicare Annual (Subsequent) preventive examination.  Review of Systems     Cardiac Risk Factors include: advanced age (>46men, >58 women)     Objective:    Today's Vitals   05/31/21 1032  Weight: 118 lb (53.5 kg)  Height: 5\' 3"  (1.6 m)   Body mass index is 20.9 kg/m.  Advanced Directives 05/31/2021  Does Patient Have a Medical Advance Directive? Yes  Type of Advance Directive Living will;Healthcare Power of Attorney  Does patient want to make changes to medical advance directive? No - Patient declined  Copy of Healthcare Power of Attorney in Chart? No - copy requested    Current Medications (verified) Outpatient Encounter Medications as of 05/31/2021  Medication Sig   aspirin 81 MG tablet Take 81 mg by mouth daily.   Multiple Vitamins-Minerals (PRESERVISION AREDS) CAPS Take by mouth in the morning and at bedtime.   Calcium Carbonate-Vitamin D  (CALCIUM + D PO) Take by mouth 2 (two) times daily. (Patient not taking: No sig reported)   No facility-administered encounter medications on file as of 05/31/2021.    Allergies (verified) Alendronate sodium   History: Past Medical History:  Diagnosis Date   CHICKENPOX, HX OF 06/12/2007   HEPATITIS NOS 06/12/2007   HYPERLIPIDEMIA 06/12/2007   OSTEOARTHRITIS 06/12/2007   OSTEOPENIA 05/24/2008   SKIN LESION 07/03/2009   UNSPECIFIED SUDDEN HEARING LOSS 07/03/2009   Past Surgical History:  Procedure Laterality Date   ABDOMINAL HYSTERECTOMY     breast biopsy     BREAST EXCISIONAL BIOPSY Bilateral    benign   OOPHORECTOMY     1 ovary   TONSILLECTOMY     Family History  Problem Relation Age of Onset   Coronary artery disease Other        female 1st degree relative, female 1st degree relative   Diabetes Other        1st degree relative   Hypertension Other    Breast cancer Cousin    Breast cancer Cousin    Breast cancer Cousin    Breast cancer Cousin    Social History   Socioeconomic History   Marital status: Widowed    Spouse name: Not on file   Number of children: 3   Years of education: Not on file   Highest education level: Not on file  Occupational History   Occupation: retired 09/02/2009 [employer]    Employer: RETIRED  Tobacco Use   Smoking status: Never   Smokeless tobacco: Never  Substance and Sexual Activity  Alcohol use: No   Drug use: Not on file   Sexual activity: Not on file  Other Topics Concern   Not on file  Social History Narrative   Not on file   Social Determinants of Health   Financial Resource Strain: Low Risk    Difficulty of Paying Living Expenses: Not hard at all  Food Insecurity: No Food Insecurity   Worried About Running Out of Food in the Last Year: Never true   Ran Out of Food in the Last Year: Never true  Transportation Needs: No Transportation Needs   Lack of Transportation (Medical): No   Lack of Transportation (Non-Medical): No  Physical  Activity: Sufficiently Active   Days of Exercise per Week: 5 days   Minutes of Exercise per Session: 30 min  Stress: No Stress Concern Present   Feeling of Stress : Not at all  Social Connections: Moderately Isolated   Frequency of Communication with Friends and Family: More than three times a week   Frequency of Social Gatherings with Friends and Family: Once a week   Attends Religious Services: Never   Database administratorActive Member of Clubs or Organizations: Yes   Attends Engineer, structuralClub or Organization Meetings: More than 4 times per year   Marital Status: Widowed    Tobacco Counseling Counseling given: Not Answered   Clinical Intake:  Pre-visit preparation completed: Yes  Pain : No/denies pain     BMI - recorded: 20.9 Nutritional Status: BMI of 19-24  Normal Nutritional Risks: None Diabetes: No  How often do you need to have someone help you when you read instructions, pamphlets, or other written materials from your doctor or pharmacy?: 1 - Never What is the last grade level you completed in school?: Master's Degree in Education  Diabetic? no  Interpreter Needed?: No  Information entered by :: Susie CassetteShenika Rai Sinagra, LPN   Activities of Daily Living In your present state of health, do you have any difficulty performing the following activities: 05/31/2021 09/04/2020  Hearing? Y N  Vision? N N  Difficulty concentrating or making decisions? N N  Walking or climbing stairs? N N  Dressing or bathing? N N  Doing errands, shopping? N N  Preparing Food and eating ? N -  Using the Toilet? N -  In the past six months, have you accidently leaked urine? N -  Do you have problems with loss of bowel control? N -  Managing your Medications? N -  Managing your Finances? N -  Housekeeping or managing your Housekeeping? N -  Some recent data might be hidden    Patient Care Team: Corwin LevinsJohn, James W, MD as PCP - General Maris BergerMcCuen, Christine, MD as Consulting Physician (Ophthalmology)  Indicate any recent Medical  Services you may have received from other than [facility] providers in the past year (date may be approximate).     Assessment:   This is a routine wellness examination for Erica Patterson.  Hearing/Vision screen Hearing Screening - Comments:: Patient wears hearing aids. Vision Screening - Comments:: Patient wears glasses.  Annual eye exam done by Dr. Maris Bergerhristine McCuen.  Dietary issues and exercise activities discussed: Current Exercise Habits: Structured exercise class, Type of exercise: walking;strength training/weights;stretching;treadmill;Other - see comments (balance), Time (Minutes): 30, Frequency (Times/Week): 5, Weekly Exercise (Minutes/Week): 150, Intensity: Moderate   Goals Addressed               This Visit's Progress     Patient Stated (pt-stated)        My goal is  to get back to walking 2 miles daily for 5 days.      Depression Screen PHQ 2/9 Scores 05/31/2021 09/04/2020 09/04/2020 08/23/2019 08/18/2018 08/13/2017 08/12/2016  PHQ - 2 Score 1 1 0 1 1 0 2  PHQ- 9 Score - - - - - 0 -    Fall Risk Fall Risk  05/31/2021 09/04/2020 09/04/2020 08/23/2019 08/18/2018  Falls in the past year? 0 1 0 0 0  Number falls in past yr: 0 0 0 - -  Injury with Fall? 0 0 0 - -  Risk for fall due to : No Fall Risks - No Fall Risks - -  Follow up Falls evaluation completed - Falls evaluation completed - -    FALL RISK PREVENTION PERTAINING TO THE HOME:  Any stairs in or around the home? Yes  If so, are there any without handrails? No  Home free of loose throw rugs in walkways, pet beds, electrical cords, etc? Yes  Adequate lighting in your home to reduce risk of falls? Yes   ASSISTIVE DEVICES UTILIZED TO PREVENT FALLS:  Life alert? Yes  Use of a cane, walker or w/c? No  Grab bars in the bathroom? Yes  Shower chair or bench in shower? No  Elevated toilet seat or a handicapped toilet? Yes   TIMED UP AND GO:  Was the test performed? No .  Length of time to ambulate 10 feet: n/a sec.   Gait  steady and fast without use of assistive device  Cognitive Function: Normal cognitive status assessed by direct observation by this Nurse Health Advisor. No abnormalities found.          Immunizations Immunization History  Administered Date(s) Administered   Fluad Quad(high Dose 65+) 06/13/2019   Influenza Split 07/17/2011, 06/25/2012   Influenza Whole 07/03/2009   Influenza, High Dose Seasonal PF 07/06/2014, 07/23/2017, 07/01/2018   Influenza,inj,Quad PF,6+ Mos 06/30/2013, 07/12/2015   Influenza-Unspecified 07/02/2016, 07/06/2020   PFIZER(Purple Top)SARS-COV-2 Vaccination 10/10/2019, 10/31/2019, 07/06/2020   Pneumococcal Conjugate-13 06/30/2013   Pneumococcal Polysaccharide-23 03/29/2006, 05/05/2011   Td 09/29/2001   Tdap 06/25/2012   Zoster Recombinat (Shingrix) 07/04/2019    TDAP status: Up to date  Flu Vaccine status: Up to date  Pneumococcal vaccine status: Up to date  Covid-19 vaccine status: Completed vaccines  Qualifies for Shingles Vaccine? Yes   Zostavax completed Yes   Shingrix Completed?: Yes  Screening Tests Health Maintenance  Topic Date Due   Zoster Vaccines- Shingrix (2 of 2) 08/29/2019   COVID-19 Vaccine (4 - Booster for Pfizer series) 11/06/2020   INFLUENZA VACCINE  04/29/2021   TETANUS/TDAP  06/25/2022   DEXA SCAN  Completed   PNA vac Low Risk Adult  Completed   HPV VACCINES  Aged Out    Health Maintenance  Health Maintenance Due  Topic Date Due   Zoster Vaccines- Shingrix (2 of 2) 08/29/2019   COVID-19 Vaccine (4 - Booster for Pfizer series) 11/06/2020   INFLUENZA VACCINE  04/29/2021    Colorectal cancer screening: No longer required.   Mammogram status: Completed 01/25/2021. Repeat every year  Bone Density status: Completed 10/20/2017. Results reflect: Bone density results: OSTEOPOROSIS. Repeat every 2-3 years. (Takes Prolia every 6 months)  Lung Cancer Screening: (Low Dose CT Chest recommended if Age 50-80 years, 30 pack-year  currently smoking OR have quit w/in 15years.) does not qualify.   Lung Cancer Screening Referral: no  Additional Screening:  Hepatitis C Screening: does not qualify; Completed no  Vision Screening: Recommended annual ophthalmology  exams for early detection of glaucoma and other disorders of the eye. Is the patient up to date with their annual eye exam?  Yes  Who is the provider or what is the name of the office in which the patient attends annual eye exams? Maris Berger, MD. If pt is not established with a provider, would they like to be referred to a provider to establish care? No .   Dental Screening: Recommended annual dental exams for proper oral hygiene  Community Resource Referral / Chronic Care Management: CRR required this visit?  No   CCM required this visit?  No      Plan:     I have personally reviewed and noted the following in the patient's chart:   Medical and social history Use of alcohol, tobacco or illicit drugs  Current medications and supplements including opioid prescriptions.  Functional ability and status Nutritional status Physical activity Advanced directives List of other physicians Hospitalizations, surgeries, and ER visits in previous 12 months Vitals Screenings to include cognitive, depression, and falls Referrals and appointments  In addition, I have reviewed and discussed with patient certain preventive protocols, quality metrics, and best practice recommendations. A written personalized care plan for preventive services as well as general preventive health recommendations were provided to patient.     Mickeal Needy, LPN   03/02/8755   Nurse Notes:  Patient is cogitatively intact. There were no vitals filed for this visit. Patient stated that she has no issues with gait or balance; does not use any assistive devices. Medications reviewed with patient; no opioid use noted. Hearing Screening - Comments:: Patient wears hearing  aids. Vision Screening - Comments:: Patient wears glasses.  Annual eye exam done by Dr. Maris Berger.

## 2021-05-31 NOTE — Patient Instructions (Signed)
[name] , Thank you for taking time to come for your Medicare Wellness Visit. I appreciate your ongoing commitment to your health goals. Please review the following plan we discussed and let me know if I can assist you in the future.   Screening recommendations/referrals: Colonoscopy: Not a candidate for screening due to age Mammogram: last done 01/25/2021 Bone Density: last done 10/20/2017 Recommended yearly ophthalmology/optometry visit for glaucoma screening and checkup Recommended yearly dental visit for hygiene and checkup  Vaccinations: Influenza vaccine: 07/06/2020; due Fall 2022 Pneumococcal vaccine: 03/29/2006, 05/05/2011, 06/30/2013 Tdap vaccine: 06/25/2012; due every 10 years Shingles vaccine: 07/04/2019, 09/02/2019   Covid-19: 10/10/2019, 10/31/2019, 07/06/2020, 02/05/2021  Advanced directives: Please bring a copy of your health care power of attorney and living will to the office at your convenience.  Conditions/risks identified: Yes; Client understands the importance of follow-up with providers by attending scheduled visits and discussed goals to eat healthier, increase physical activity, exercise the brain, socialize more, get enough sleep and make time for laughter.  Next appointment: Patient resides in [place].  Patient will be transferring care.   Preventive Care 60 Years and Older, Female Preventive care refers to lifestyle choices and visits with your health care provider that can promote health and wellness. What does preventive care include? A yearly physical exam. This is also called an annual well check. Dental exams once or twice a year. Routine eye exams. Ask your health care provider how often you should have your eyes checked. Personal lifestyle choices, including: Daily care of your teeth and gums. Regular physical activity. Eating a healthy diet. Avoiding tobacco and drug use. Limiting alcohol use. Practicing safe sex. Taking low-dose aspirin every  day. Taking vitamin and mineral supplements as recommended by your health care provider. What happens during an annual well check? The services and screenings done by your health care provider during your annual well check will depend on your age, overall health, lifestyle risk factors, and family history of disease. Counseling  Your health care provider may ask you questions about your: Alcohol use. Tobacco use. Drug use. Emotional well-being. Home and relationship well-being. Sexual activity. Eating habits. History of falls. Memory and ability to understand (cognition). Work and work Astronomer. Reproductive health. Screening  You may have the following tests or measurements: Height, weight, and BMI. Blood pressure. Lipid and cholesterol levels. These may be checked every 5 years, or more frequently if you are over 41 years old. Skin check. Lung cancer screening. You may have this screening every year starting at age 65 if you have a 30-pack-year history of smoking and currently smoke or have quit within the past 15 years. Fecal occult blood test (FOBT) of the stool. You may have this test every year starting at age 13. Flexible sigmoidoscopy or colonoscopy. You may have a sigmoidoscopy every 5 years or a colonoscopy every 10 years starting at age 73. Hepatitis C blood test. Hepatitis B blood test. Sexually transmitted disease (STD) testing. Diabetes screening. This is done by checking your blood sugar (glucose) after you have not eaten for a while (fasting). You may have this done every 1-3 years. Bone density scan. This is done to screen for osteoporosis. You may have this done starting at age 28. Mammogram. This may be done every 1-2 years. Talk to your health care provider about how often you should have regular mammograms. Talk with your health care provider about your test results, treatment options, and if necessary, the need for more tests. Vaccines  Your health care  provider may recommend certain vaccines, such as: Influenza vaccine. This is recommended every year. Tetanus, diphtheria, and acellular pertussis (Tdap, Td) vaccine. You may need a Td booster every 10 years. Zoster vaccine. You may need this after age 25. Pneumococcal 13-valent conjugate (PCV13) vaccine. One dose is recommended after age 29. Pneumococcal polysaccharide (PPSV23) vaccine. One dose is recommended after age 2. Talk to your health care provider about which screenings and vaccines you need and how often you need them. This information is not intended to replace advice given to you by your health care provider. Make sure you discuss any questions you have with your health care provider. Document Released: 10/12/2015 Document Revised: 06/04/2016 Document Reviewed: 07/17/2015 Elsevier Interactive Patient Education  2017 [place] Prevention in the Home Falls can cause injuries. They can happen to people of all ages. There are many things you can do to make your home safe and to help prevent falls. What can I do on the outside of my home? Regularly fix the edges of walkways and driveways and fix any cracks. Remove anything that might make you trip as you walk through a door, such as a raised step or threshold. Trim any bushes or trees on the path to your home. Use bright outdoor lighting. Clear any walking paths of anything that might make someone trip, such as rocks or tools. Regularly check to see if handrails are loose or broken. Make sure that both sides of any steps have handrails. Any raised decks and porches should have guardrails on the edges. Have any leaves, snow, or ice cleared regularly. Use sand or salt on walking paths during winter. Clean up any spills in your garage right away. This includes oil or grease spills. What can I do in the bathroom? Use night lights. Install grab bars by the toilet and in the tub and shower. Do not use towel bars as grab  bars. Use non-skid mats or decals in the tub or shower. If you need to sit down in the shower, use a plastic, non-slip stool. Keep the floor dry. Clean up any water that spills on the floor as soon as it happens. Remove soap buildup in the tub or shower regularly. Attach bath mats securely with double-sided non-slip rug tape. Do not have throw rugs and other things on the floor that can make you trip. What can I do in the bedroom? Use night lights. Make sure that you have a light by your bed that is easy to reach. Do not use any sheets or blankets that are too big for your bed. They should not hang down onto the floor. Have a firm chair that has side arms. You can use this for support while you get dressed. Do not have throw rugs and other things on the floor that can make you trip. What can I do in the kitchen? Clean up any spills right away. Avoid walking on wet floors. Keep items that you use a lot in easy-to-reach places. If you need to reach something above you, use a strong step stool that has a grab bar. Keep electrical cords out of the way. Do not use floor polish or wax that makes floors slippery. If you must use wax, use non-skid floor wax. Do not have throw rugs and other things on the floor that can make you trip. What can I do with my stairs? Do not leave any items on the stairs. Make sure that there are  handrails on both sides of the stairs and use them. Fix handrails that are broken or loose. Make sure that handrails are as long as the stairways. Check any carpeting to make sure that it is firmly attached to the stairs. Fix any carpet that is loose or worn. Avoid having throw rugs at the top or bottom of the stairs. If you do have throw rugs, attach them to the floor with carpet tape. Make sure that you have a light switch at the top of the stairs and the bottom of the stairs. If you do not have them, ask someone to add them for you. What else can I do to help prevent  falls? Wear shoes that: Do not have high heels. Have rubber bottoms. Are comfortable and fit you well. Are closed at the toe. Do not wear sandals. If you use a stepladder: Make sure that it is fully opened. Do not climb a closed stepladder. Make sure that both sides of the stepladder are locked into place. Ask someone to hold it for you, if possible. Clearly mark and make sure that you can see: Any grab bars or handrails. First and last steps. Where the edge of each step is. Use tools that help you move around (mobility aids) if they are needed. These include: Canes. Walkers. Scooters. Crutches. Turn on the lights when you go into a dark area. Replace any light bulbs as soon as they burn out. Set up your furniture so you have a clear path. Avoid moving your furniture around. If any of your floors are uneven, fix them. If there are any pets around you, be aware of where they are. Review your medicines with your doctor. Some medicines can make you feel dizzy. This can increase your chance of falling. Ask your doctor what other things that you can do to help prevent falls. This information is not intended to replace advice given to you by your health care provider. Make sure you discuss any questions you have with your health care provider. Document Released: 07/12/2009 Document Revised: 02/21/2016 Document Reviewed: 10/20/2014 Elsevier Interactive Patient Education  2017 [employer].

## 2021-07-29 DIAGNOSIS — H2513 Age-related nuclear cataract, bilateral: Secondary | ICD-10-CM | POA: Diagnosis not present

## 2021-07-29 DIAGNOSIS — H47321 Drusen of optic disc, right eye: Secondary | ICD-10-CM | POA: Diagnosis not present

## 2021-07-29 DIAGNOSIS — H5203 Hypermetropia, bilateral: Secondary | ICD-10-CM | POA: Diagnosis not present

## 2021-07-29 DIAGNOSIS — H353131 Nonexudative age-related macular degeneration, bilateral, early dry stage: Secondary | ICD-10-CM | POA: Diagnosis not present

## 2021-08-03 ENCOUNTER — Telehealth: Payer: Self-pay

## 2021-08-03 NOTE — Telephone Encounter (Signed)
Prior Auth required for Prolia  PA PROCESS DETAILS: PA is required. PA can be initiated by calling [phone] or online at [email or web address] 

## 2021-08-12 NOTE — Telephone Encounter (Signed)
Prior Auth for Prolia initiated via [email or web address] KEY: [id number] , PA Case ID: [id number]

## 2021-08-24 NOTE — Telephone Encounter (Signed)
Pt ready for scheduling on or after 08/10/21  Out-of-pocket cost due at time of visit: $40  Primary: [employer] Prolia co-insurance: 0% Admin fee co-insurance: $40  Secondary: n/a Prolia co-insurance:  Admin fee co-insurance:   Deductible: $150 of $150 met  Prior Auth: APPROVED PA# [id number] Valid: 09/29/21-09/28/22    ** This summary of benefits is an estimation of the patient's out-of-pocket cost. Exact cost may very based on individual plan coverage.

## 2021-09-02 DIAGNOSIS — R55 Syncope and collapse: Secondary | ICD-10-CM | POA: Diagnosis not present

## 2021-09-05 ENCOUNTER — Other Ambulatory Visit (INDEPENDENT_AMBULATORY_CARE_PROVIDER_SITE_OTHER): Payer: Medicare PPO

## 2021-09-05 DIAGNOSIS — Z1159 Encounter for screening for other viral diseases: Secondary | ICD-10-CM

## 2021-09-05 DIAGNOSIS — Z0001 Encounter for general adult medical examination with abnormal findings: Secondary | ICD-10-CM

## 2021-09-05 LAB — CBC WITH DIFFERENTIAL/PLATELET
Basophils Absolute: 0.1 10*3/uL (ref 0.0–0.1)
Basophils Relative: 0.9 % (ref 0.0–3.0)
Eosinophils Absolute: 0 10*3/uL (ref 0.0–0.7)
Eosinophils Relative: 0.4 % (ref 0.0–5.0)
HCT: 39.8 % (ref 36.0–46.0)
Hemoglobin: 13.4 g/dL (ref 12.0–15.0)
Lymphocytes Relative: 22.3 % (ref 12.0–46.0)
Lymphs Abs: 1.5 10*3/uL (ref 0.7–4.0)
MCHC: 33.6 g/dL (ref 30.0–36.0)
MCV: 91.5 fl (ref 78.0–100.0)
Monocytes Absolute: 0.6 10*3/uL (ref 0.1–1.0)
Monocytes Relative: 8.1 % (ref 3.0–12.0)
Neutro Abs: 4.7 10*3/uL (ref 1.4–7.7)
Neutrophils Relative %: 68.3 % (ref 43.0–77.0)
Platelets: 257 10*3/uL (ref 150.0–400.0)
RBC: 4.35 Mil/uL (ref 3.87–5.11)
RDW: 13.6 % (ref 11.5–15.5)
WBC: 6.8 10*3/uL (ref 4.0–10.5)

## 2021-09-05 LAB — LIPID PANEL
Cholesterol: 216 mg/dL — ABNORMAL HIGH (ref 0–200)
HDL: 101.5 mg/dL (ref >39.00)
LDL Cholesterol: 102 mg/dL — ABNORMAL HIGH (ref 0–99)
NonHDL: 114.25
Total CHOL/HDL Ratio: 2
Triglycerides: 62 mg/dL (ref 0.0–149.0)
VLDL: 12.4 mg/dL (ref 0.0–40.0)

## 2021-09-05 LAB — URINALYSIS, ROUTINE W REFLEX MICROSCOPIC
Bilirubin Urine: NEGATIVE
Nitrite: NEGATIVE
Specific Gravity, Urine: <=1.005 — AB (ref 1.000–1.030)
Total Protein, Urine: NEGATIVE
Urine Glucose: NEGATIVE
Urobilinogen, UA: 0.2 (ref 0.0–1.0)
pH: 6 (ref 5.0–8.0)

## 2021-09-05 LAB — HEPATITIS C ANTIBODY
Hepatitis C Ab: NONREACTIVE
SIGNAL TO CUT-OFF: 0.03 (ref <1.00)

## 2021-09-05 LAB — HEPATIC FUNCTION PANEL
ALT: 15 U/L (ref 0–35)
AST: 20 U/L (ref 0–37)
Albumin: 4.3 g/dL (ref 3.5–5.2)
Alkaline Phosphatase: 45 U/L (ref 39–117)
Bilirubin, Direct: 0.1 mg/dL (ref 0.0–0.3)
Total Bilirubin: 0.6 mg/dL (ref 0.2–1.2)
Total Protein: 7.1 g/dL (ref 6.0–8.3)

## 2021-09-05 LAB — BASIC METABOLIC PANEL
BUN: 10 mg/dL (ref 6–23)
CO2: 25 mEq/L (ref 19–32)
Calcium: 9.1 mg/dL (ref 8.4–10.5)
Chloride: 100 mEq/L (ref 96–112)
Creatinine, Ser: 0.71 mg/dL (ref 0.40–1.20)
GFR: 80.09 mL/min (ref >60.00)
Glucose, Bld: 89 mg/dL (ref 70–99)
Potassium: 3.8 mEq/L (ref 3.5–5.1)
Sodium: 135 mEq/L (ref 135–145)

## 2021-09-05 LAB — TSH: TSH: 1.16 u[IU]/mL (ref 0.35–5.50)

## 2021-09-06 ENCOUNTER — Other Ambulatory Visit: Payer: Self-pay

## 2021-09-06 ENCOUNTER — Ambulatory Visit (INDEPENDENT_AMBULATORY_CARE_PROVIDER_SITE_OTHER): Payer: Medicare PPO | Admitting: Internal Medicine

## 2021-09-06 ENCOUNTER — Encounter: Payer: Self-pay | Admitting: Internal Medicine

## 2021-09-06 VITALS — BP 120/70 | HR 87 | Temp 98.0°F | Ht 63.0 in | Wt 121.0 lb

## 2021-09-06 DIAGNOSIS — G459 Transient cerebral ischemic attack, unspecified: Secondary | ICD-10-CM

## 2021-09-06 DIAGNOSIS — F32A Depression, unspecified: Secondary | ICD-10-CM | POA: Diagnosis not present

## 2021-09-06 DIAGNOSIS — R300 Dysuria: Secondary | ICD-10-CM | POA: Diagnosis not present

## 2021-09-06 DIAGNOSIS — E7800 Pure hypercholesterolemia, unspecified: Secondary | ICD-10-CM | POA: Diagnosis not present

## 2021-09-06 DIAGNOSIS — Z0001 Encounter for general adult medical examination with abnormal findings: Secondary | ICD-10-CM

## 2021-09-06 DIAGNOSIS — R55 Syncope and collapse: Secondary | ICD-10-CM

## 2021-09-06 DIAGNOSIS — F419 Anxiety disorder, unspecified: Secondary | ICD-10-CM

## 2021-09-06 MED ORDER — CITALOPRAM HYDROBROMIDE 10 MG PO TABS: 10.0000 mg | ORAL_TABLET | Freq: Every day | ORAL | 3 refills | Status: AC

## 2021-09-06 MED ORDER — ATORVASTATIN CALCIUM 10 MG PO TABS: 10.0000 mg | ORAL_TABLET | Freq: Every day | ORAL | 3 refills | Status: AC

## 2021-09-06 NOTE — Progress Notes (Signed)
Patient ID: Erica Patterson, female   DOB: Jan 31, 1941, 80 y.o.   MRN: [id number]         Chief Complaint:: wellness exam and hld, recent TIA, syncope, depression, dysuria       HPI:  Erica Patterson is a 80 y.o. female here for wellness exam; declines covid booster, shingrix, o/w up to date                        Also recently taken to ED after bfast syncope on mon dec 5, tx for dehydration and TIA/syncope; Pt simply contd on her ASA 81, not started on statin however, though AVS she present indicate she was diagnosed with TIA.   Moved recently with increased stress, and admits to mild depressed symptoms recently, unhappy where she is liviing now as the lifestyle is regimented and extremely long periods for meals she does not want, but will have new better place after she moves again she feels after jan 1.    Has planned cardiology f/u after ED at [facility] soon.  Also incidentally today with mild dysuria x 2 days but Denies urinary symptoms such as frequency, urgency, flank pain, hematuria or n/v, fever, chills.   Wt Readings from Last 3 Encounters:  09/06/21 121 lb (54.9 kg)  05/31/21 118 lb (53.5 kg)  01/21/21 123 lb 6.4 oz (56 kg)   BP Readings from Last 3 Encounters:  09/06/21 120/70  01/21/21 130/70  01/01/21 106/70   Immunization History  Administered Date(s) Administered   Fluad Quad(high Dose 65+) 06/13/2019   Influenza Split 07/17/2011, 06/25/2012   Influenza Whole 07/03/2009   Influenza, High Dose Seasonal PF 07/06/2014, 07/23/2017, 07/01/2018, 06/27/2021   Influenza,inj,Quad PF,6+ Mos 06/30/2013, 07/12/2015   Influenza-Unspecified 07/02/2016, 07/06/2020   PFIZER(Purple Top)SARS-COV-2 Vaccination 10/10/2019, 10/31/2019, 07/06/2020, 06/27/2021   Pneumococcal Conjugate-13 06/30/2013   Pneumococcal Polysaccharide-23 03/29/2006, 05/05/2011   Td 09/29/2001   Tdap 06/25/2012   Zoster Recombinat (Shingrix) 07/04/2019   There are no preventive care reminders to display for this patient.      Past Medical History:  Diagnosis Date   CHICKENPOX, HX OF 06/12/2007   HEPATITIS NOS 06/12/2007   HYPERLIPIDEMIA 06/12/2007   OSTEOARTHRITIS 06/12/2007   OSTEOPENIA 05/24/2008   SKIN LESION 07/03/2009   UNSPECIFIED SUDDEN HEARING LOSS 07/03/2009   Past Surgical History:  Procedure Laterality Date   ABDOMINAL HYSTERECTOMY     breast biopsy     BREAST EXCISIONAL BIOPSY Bilateral    benign   OOPHORECTOMY     1 ovary   TONSILLECTOMY      reports that she has never smoked. She has never used smokeless tobacco. She reports that she does not drink alcohol. No history on file for drug use. family history includes Breast cancer in her cousin, cousin, cousin, and cousin; Coronary artery disease in an other family member; Diabetes in an other family member; Hypertension in an other family member. Allergies  Allergen Reactions   Alendronate Sodium    Current Outpatient Medications on File Prior to Visit  Medication Sig Dispense Refill   aspirin 81 MG tablet Take 81 mg by mouth daily.     Multiple Vitamins-Minerals (PRESERVISION AREDS) CAPS Take by mouth in the morning and at bedtime.     Calcium Carbonate-Vitamin D (CALCIUM + D PO) Take by mouth 2 (two) times daily. (Patient not taking: Reported on 12/10/2020)     No current facility-administered medications on file prior to visit.  ROS:  All others reviewed and negative.  Objective        PE:  BP 120/70 (BP Location: Left Arm, Patient Position: Sitting, Cuff Size: Normal)   Pulse 87   Temp 98 F (36.7 C) (Oral)   Ht 5\' 3"  (1.6 m)   Wt 121 lb (54.9 kg)   SpO2 99%   BMI 21.43 kg/m                 Constitutional: Pt appears in NAD               HENT: Head: NCAT.                Right Ear: External ear normal.                 Left Ear: External ear normal.                Eyes: . Pupils are equal, round, and reactive to light. Conjunctivae and EOM are normal               Nose: without d/c or deformity               Neck: Neck  supple. Gross normal ROM               Cardiovascular: Normal rate and regular rhythm.                 Pulmonary/Chest: Effort normal and breath sounds without rales or wheezing.                Abd:  Soft, NT, ND, + BS, no organomegaly               Neurological: Pt is alert. At baseline orientation, motor grossly intact               Skin: Skin is warm. No rashes, no other new lesions, LE edema - none               Psychiatric: Pt behavior is normal without agitation , unhappy, frustrated, depressed affect  Micro: none  Cardiac tracings I have personally interpreted today:  none  Pertinent Radiological findings (summarize): none   Lab Results  Component Value Date   WBC 6.8 09/05/2021   HGB 13.4 09/05/2021   HCT 39.8 09/05/2021   PLT 257.0 09/05/2021   GLUCOSE 89 09/05/2021   CHOL 216 (H) 09/05/2021   TRIG 62.0 09/05/2021   HDL 101.50 09/05/2021   LDLDIRECT 110.2 06/30/2013   LDLCALC 102 (H) 09/05/2021   ALT 15 09/05/2021   AST 20 09/05/2021   NA 135 09/05/2021   K 3.8 09/05/2021   CL 100 09/05/2021   CREATININE 0.71 09/05/2021   BUN 10 09/05/2021   CO2 25 09/05/2021   TSH 1.16 09/05/2021   HGBA1C 5.7 08/05/2016   MICROALBUR <0.7 08/05/2016   Assessment/Plan:  Erica Patterson is a 80 y.o. White or Caucasian [1] female with  has a past medical history of CHICKENPOX, HX OF (06/12/2007), HEPATITIS NOS (06/12/2007), HYPERLIPIDEMIA (06/12/2007), OSTEOARTHRITIS (06/12/2007), OSTEOPENIA (05/24/2008), SKIN LESION (07/03/2009), and UNSPECIFIED SUDDEN HEARING LOSS (07/03/2009).  Encounter for well adult exam with abnormal findings Age and sex appropriate education and counseling updated with regular exercise and diet Referrals for preventative services - none needed Immunizations addressed - declines covid booster, shingrix Smoking counseling  - none needed Evidence for depression or other mood disorder - depression - see below Most recent labs reviewed.  I have personally reviewed and  have noted: 1) the patient's medical and social history 2) The patient's current medications and supplements 3) The patient's height, weight, and BMI have been recorded in the chart   Anxiety and depression With mild recent situational worsening, declines need for counseling, ok for celexa 10 qd  Hyperlipidemia Lab Results  Component Value Date   LDLCALC 102 (H) 09/05/2021   Uncontrolled with goal ldl now < 70, pt to start lipitor 10 qd and f/u labs next visit   Dysuria Mild recent onset, for urine and cx,  to f/u any worsening symptoms or concerns  TIA (transient ischemic attack) Now on asa 81, started statin today, declines neurology referral  Syncope Recent episode dec 5 taken to ED by ambulance, attributed to dehydration per patient, has cardiology f/u planned, declines other here such as echo, carotids  Followup: Return in about 6 months (around 03/07/2022).  Cathlean Cower, MD 09/08/2021 7:04 AM [facility]

## 2021-09-06 NOTE — Patient Instructions (Signed)
Please take all new medication as prescribed - the lipitor 10 mg per day, and celexa 10 mg per day  Please continue all other medications as before, and refills have been done if requested.  Please have the pharmacy call with any other refills you may need.  Please continue your efforts at being more active, low cholesterol diet, and weight control.  You are otherwise up to date with prevention measures today.  Please keep your appointments with your specialists as you may have planned  Please go to the LAB at the blood drawing area for the tests to be done - just for the urine culture today  You will be contacted by phone if any changes need to be made immediately.  Otherwise, you will receive a letter about your results with an explanation, but please check with MyChart first.  Please remember to sign up for MyChart if you have not done so, as this will be important to you in the future with finding out test results, communicating by private email, and scheduling acute appointments online when needed.  Please make an Appointment to return in 6 months, or sooner if needed, also with Lab Appointment for testing done 3-5 days before at the FIRST FLOOR Lab (so this is for TWO appointments - please see the scheduling desk as you leave)  Due to the ongoing Covid 19 pandemic, our lab now requires an appointment for any labs done at our office.  If you need labs done and do not have an appointment, please call our office ahead of time to schedule before presenting to the lab for your testing.

## 2021-09-08 ENCOUNTER — Encounter: Payer: Self-pay | Admitting: Internal Medicine

## 2021-09-08 DIAGNOSIS — G459 Transient cerebral ischemic attack, unspecified: Secondary | ICD-10-CM | POA: Insufficient documentation

## 2021-09-08 DIAGNOSIS — R55 Syncope and collapse: Secondary | ICD-10-CM | POA: Insufficient documentation

## 2021-09-08 NOTE — Assessment & Plan Note (Signed)
With mild recent situational worsening, declines need for counseling, ok for celexa 10 qd

## 2021-09-08 NOTE — Assessment & Plan Note (Signed)
Mild recent onset, for urine and cx,  to f/u any worsening symptoms or concerns

## 2021-09-08 NOTE — Assessment & Plan Note (Signed)
Lab Results  Component Value Date   LDLCALC 102 (H) 09/05/2021   Uncontrolled with goal ldl now < 70, pt to start lipitor 10 qd and f/u labs next visit

## 2021-09-08 NOTE — Assessment & Plan Note (Signed)
Recent episode dec 5 taken to ED by ambulance, attributed to dehydration per patient, has cardiology f/u planned, declines other here such as echo, carotids

## 2021-09-08 NOTE — Assessment & Plan Note (Signed)
Now on asa 81, started statin today, declines neurology referral

## 2021-09-08 NOTE — Assessment & Plan Note (Signed)
Age and sex appropriate education and counseling updated with regular exercise and diet Referrals for preventative services - none needed Immunizations addressed - declines covid booster, shingrix Smoking counseling  - none needed Evidence for depression or other mood disorder - depression - see below Most recent labs reviewed. I have personally reviewed and have noted: 1) the patient's medical and social history 2) The patient's current medications and supplements 3) The patient's height, weight, and BMI have been recorded in the chart

## 2021-09-26 DIAGNOSIS — R55 Syncope and collapse: Secondary | ICD-10-CM | POA: Diagnosis not present

## 2021-10-03 ENCOUNTER — Ambulatory Visit (INDEPENDENT_AMBULATORY_CARE_PROVIDER_SITE_OTHER): Payer: Medicare PPO

## 2021-10-03 ENCOUNTER — Other Ambulatory Visit: Payer: Self-pay

## 2021-10-03 DIAGNOSIS — M810 Age-related osteoporosis without current pathological fracture: Secondary | ICD-10-CM

## 2021-10-03 MED ORDER — DENOSUMAB 60 MG/ML ~~LOC~~ SOSY
60.0000 mg | PREFILLED_SYRINGE | Freq: Once | SUBCUTANEOUS | Status: AC
  Administered 2021-10-03: 60 mg via SUBCUTANEOUS

## 2021-10-03 NOTE — Progress Notes (Signed)
After obtaining consent, and per orders of Dr. Jonny Ruiz, injection of Prolia given by Lum Babe. Patient instructed to remain in clinic for 20 minutes afterwards, and to report any adverse reaction to me immediately. Injection administered in left arm and was tolerated well.

## 2021-10-03 NOTE — Progress Notes (Signed)
Patient ID: Erica Patterson, female   DOB: 12/12/1940, 80 y.o.   MRN: [id number]  Medical screening examination/treatment/procedure(s) were performed by non-physician practitioner and as supervising physician I was immediately available for consultation/collaboration. I agree with above. Salisha Bardsley, MD   

## 2021-11-14 NOTE — Telephone Encounter (Signed)
Last Prolia inj 10/03/21 °Next Prolia inj due 04/03/22 °

## 2021-12-26 ENCOUNTER — Other Ambulatory Visit: Payer: Self-pay | Admitting: Internal Medicine

## 2021-12-26 DIAGNOSIS — Z1231 Encounter for screening mammogram for malignant neoplasm of breast: Secondary | ICD-10-CM

## 2022-01-29 ENCOUNTER — Ambulatory Visit
Admission: RE | Admit: 2022-01-29 | Discharge: 2022-01-29 | Disposition: A | Discharge status: Home / Self Care | Payer: Medicare PPO | Source: Ambulatory Visit | Attending: Internal Medicine | Admitting: Internal Medicine

## 2022-01-29 ENCOUNTER — Inpatient Hospital Stay: Admission: RE | Admit: 2022-01-29 | Payer: Medicare PPO | Source: Ambulatory Visit

## 2022-01-29 DIAGNOSIS — Z1231 Encounter for screening mammogram for malignant neoplasm of breast: Secondary | ICD-10-CM | POA: Diagnosis not present

## 2022-01-29 IMAGING — DX DG KNEE AP/LAT W/ SUNRISE*L*
3 series · 3 of 3 positions shown · non-contrast
Comparison: Left knee radiograph September 27, 2012.

CLINICAL DATA: Right greater than left knee pain.

EXAM:
LEFT KNEE 3 VIEWS

[knee ap]
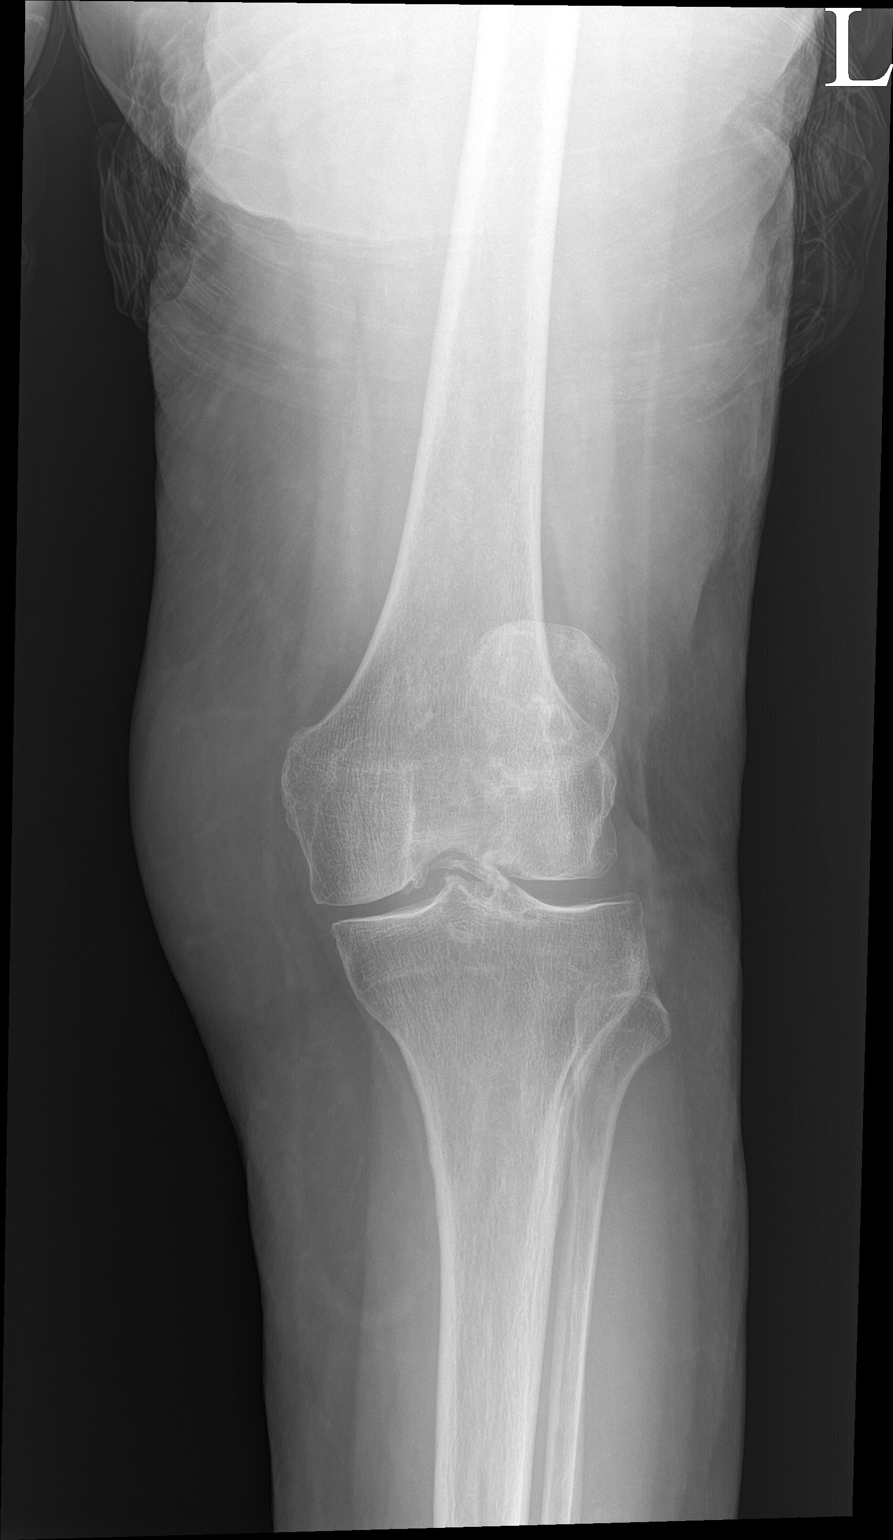

[knee lat]
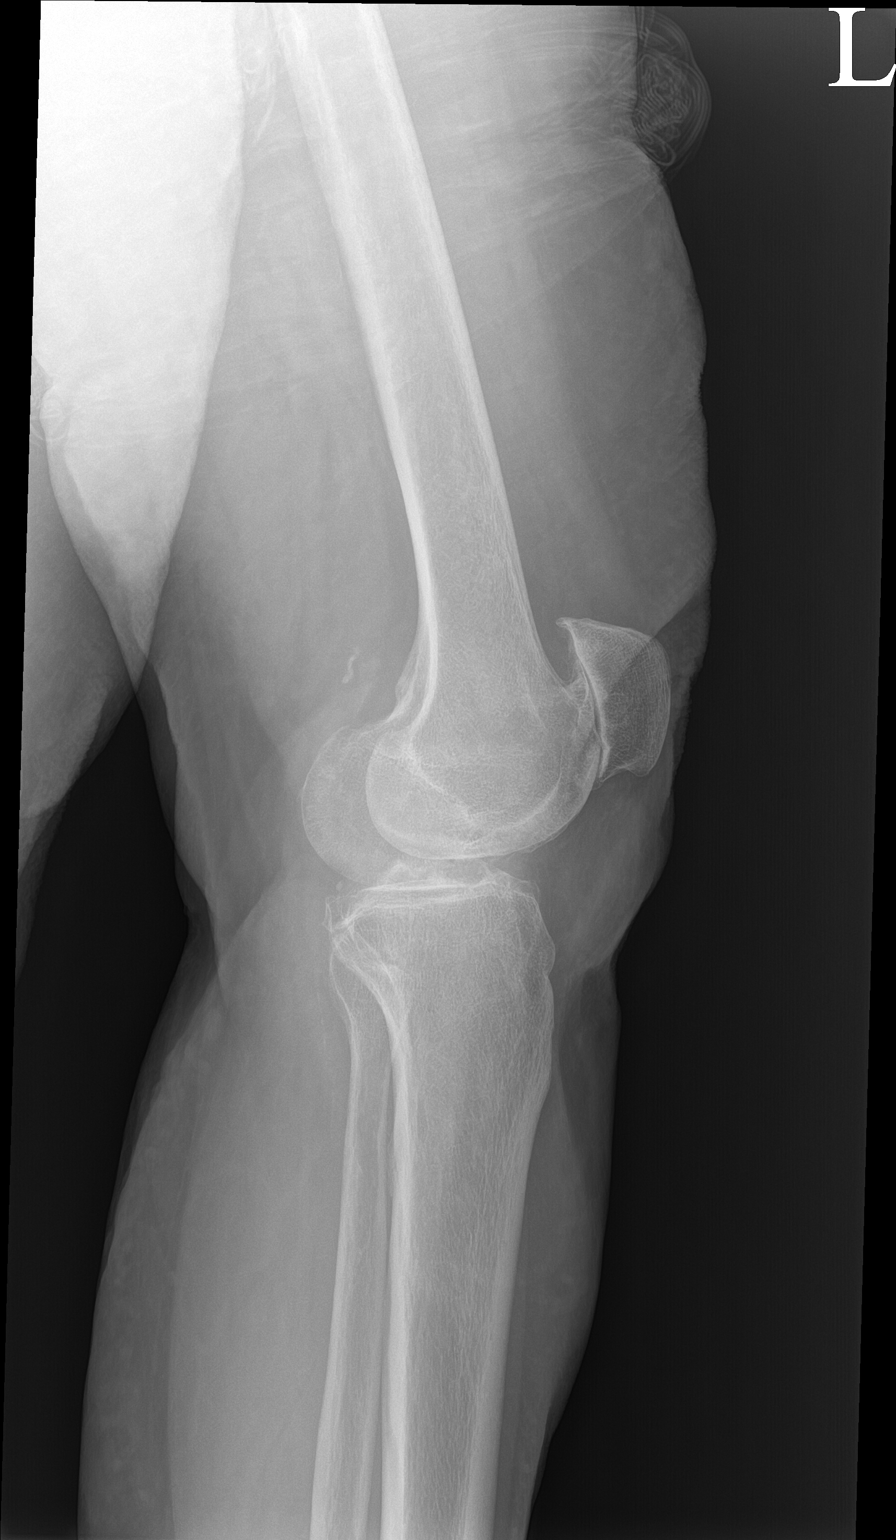

[view not recorded]
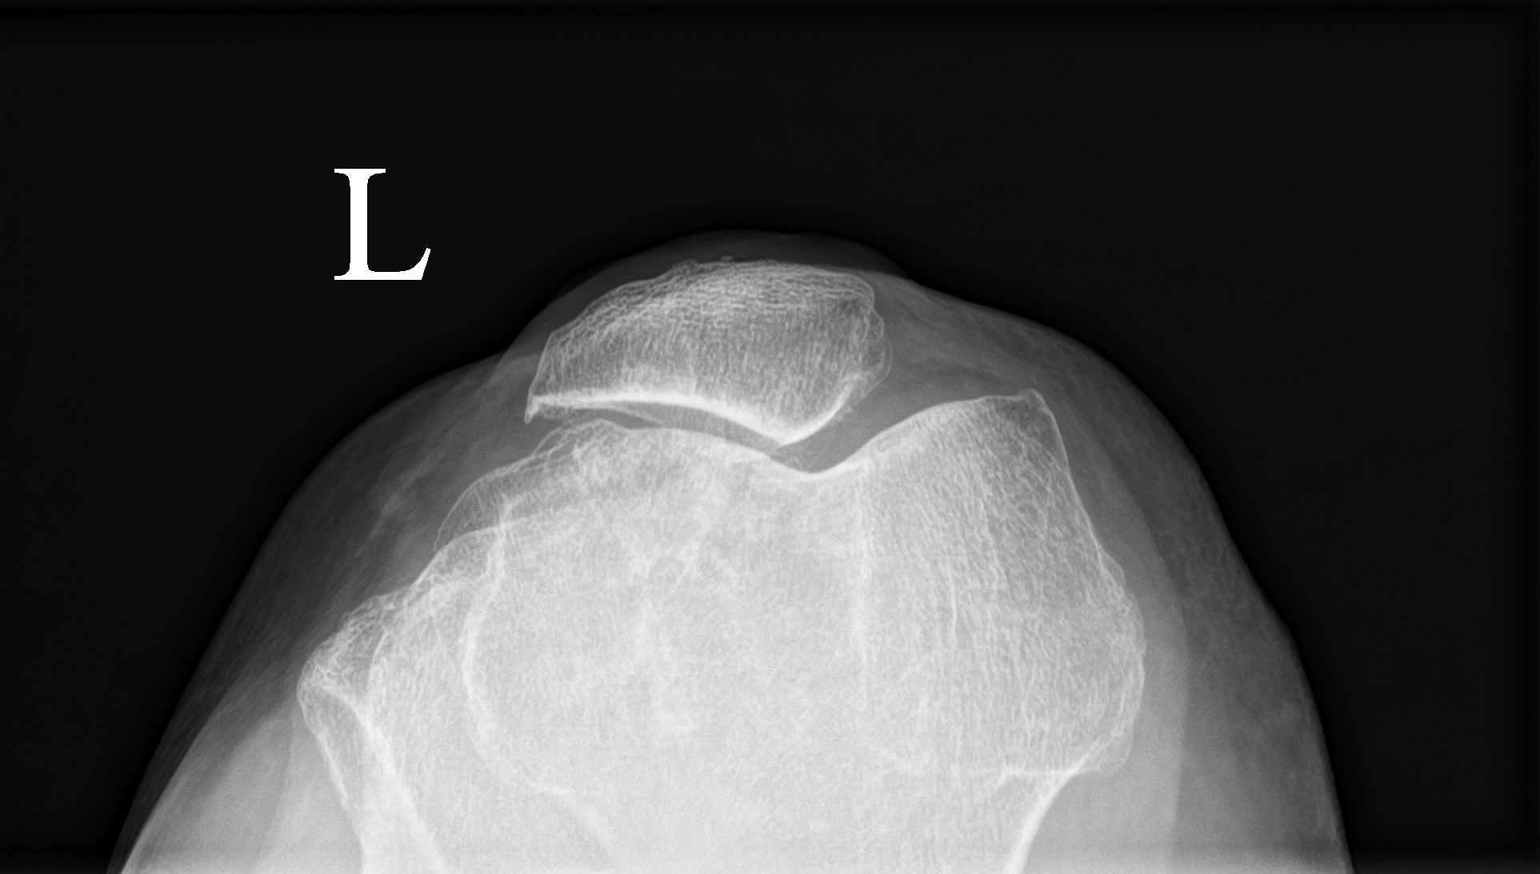

[3 of 3 positions shown; findings below may reference images not displayed]

FINDINGS: No evidence of fracture, dislocation, or joint effusion.
Tricompartment degenerative change most prominent in the
patellofemoral compartment where it is moderate but unchanged
compared to prior. Soft tissues are unremarkable.
IMPRESSION: No acute abnormality.

Similar tricompartment degenerative change, most prominent in the
patellofemoral compartment.

## 2022-03-01 NOTE — Telephone Encounter (Signed)
Prolia VOB initiated via [email or web address]  Last OV:  Next OV:  Last Prolia inj 10/03/21 Next Prolia inj due 04/03/22  

## 2022-03-12 ENCOUNTER — Ambulatory Visit: Payer: Medicare PPO | Admitting: Internal Medicine

## 2022-03-14 DIAGNOSIS — G459 Transient cerebral ischemic attack, unspecified: Secondary | ICD-10-CM | POA: Diagnosis not present

## 2022-03-14 DIAGNOSIS — F32A Depression, unspecified: Secondary | ICD-10-CM | POA: Diagnosis not present

## 2022-03-14 DIAGNOSIS — M810 Age-related osteoporosis without current pathological fracture: Secondary | ICD-10-CM | POA: Diagnosis not present

## 2022-03-28 NOTE — Telephone Encounter (Signed)
Pt archived in [email or web address].  Please advise if patient and/or provider wish to proceed with Prolia therpay.  

## 2022-04-30 DIAGNOSIS — Z780 Asymptomatic menopausal state: Secondary | ICD-10-CM | POA: Diagnosis not present

## 2022-04-30 DIAGNOSIS — Z79899 Other long term (current) drug therapy: Secondary | ICD-10-CM | POA: Diagnosis not present

## 2022-04-30 DIAGNOSIS — M810 Age-related osteoporosis without current pathological fracture: Secondary | ICD-10-CM | POA: Diagnosis not present

## 2022-06-12 DIAGNOSIS — H35363 Drusen (degenerative) of macula, bilateral: Secondary | ICD-10-CM | POA: Diagnosis not present

## 2022-06-12 DIAGNOSIS — H2513 Age-related nuclear cataract, bilateral: Secondary | ICD-10-CM | POA: Diagnosis not present

## 2022-06-19 DIAGNOSIS — R799 Abnormal finding of blood chemistry, unspecified: Secondary | ICD-10-CM | POA: Diagnosis not present

## 2022-06-19 DIAGNOSIS — G2581 Restless legs syndrome: Secondary | ICD-10-CM | POA: Diagnosis not present

## 2022-06-19 DIAGNOSIS — Z23 Encounter for immunization: Secondary | ICD-10-CM | POA: Diagnosis not present

## 2022-06-19 DIAGNOSIS — M810 Age-related osteoporosis without current pathological fracture: Secondary | ICD-10-CM | POA: Diagnosis not present

## 2022-07-10 DIAGNOSIS — M810 Age-related osteoporosis without current pathological fracture: Secondary | ICD-10-CM | POA: Diagnosis not present

## 2023-05-05 DIAGNOSIS — Z1231 Encounter for screening mammogram for malignant neoplasm of breast: Secondary | ICD-10-CM | POA: Diagnosis not present

## 2023-06-18 DIAGNOSIS — H47321 Drusen of optic disc, right eye: Secondary | ICD-10-CM | POA: Diagnosis not present

## 2023-06-18 DIAGNOSIS — H35363 Drusen (degenerative) of macula, bilateral: Secondary | ICD-10-CM | POA: Diagnosis not present

## 2023-06-18 DIAGNOSIS — H2513 Age-related nuclear cataract, bilateral: Secondary | ICD-10-CM | POA: Diagnosis not present

## 2023-07-03 ENCOUNTER — Ambulatory Visit (INDEPENDENT_AMBULATORY_CARE_PROVIDER_SITE_OTHER): Payer: Medicare PPO | Admitting: *Deleted

## 2023-07-03 VITALS — Ht 63.0 in | Wt 120.0 lb

## 2023-07-03 DIAGNOSIS — Z0000 Encounter for general adult medical examination without abnormal findings: Secondary | ICD-10-CM

## 2023-07-03 NOTE — Patient Instructions (Addendum)
Erica Patterson , Thank you for taking time to come for your Medicare Wellness Visit. I appreciate your ongoing commitment to your health goals. Please review the following plan we discussed and let me know if I can assist you in the future.   These are the goals we discussed:  Goals       Patient Stated (pt-stated)      My goal is to get back to walking 2 miles daily for 5 days.        This is a list of the screening recommended for you and due dates:  Health Maintenance  Topic Date Due   DTaP/Tdap/Td vaccine (3 - Td or Tdap) 06/25/2022   COVID-19 Vaccine (6 - 2023-24 season) 05/31/2023   Medicare Annual Wellness Visit  07/02/2024   Pneumonia Vaccine  Completed   Flu Shot  Completed   DEXA scan (bone density measurement)  Completed   Zoster (Shingles) Vaccine  Completed   HPV Vaccine  Aged Out   Hepatitis C Screening  Discontinued     Health Maintenance, Female Adopting a healthy lifestyle and getting preventive care are important in promoting health and wellness. Ask your health care provider about: The right schedule for you to have regular tests and exams. Things you can do on your own to prevent diseases and keep yourself healthy. What should I know about diet, weight, and exercise? Eat a healthy diet  Eat a diet that includes plenty of vegetables, fruits, low-fat dairy products, and lean protein. Do not eat a lot of foods that are high in solid fats, added sugars, or sodium. Maintain a healthy weight Body mass index (BMI) is used to identify weight problems. It estimates body fat based on height and weight. Your health care provider can help determine your BMI and help you achieve or maintain a healthy weight. Get regular exercise Get regular exercise. This is one of the most important things you can do for your health. Most adults should: Exercise for at least 150 minutes each week. The exercise should increase your heart rate and make you sweat (moderate-intensity  exercise). Do strengthening exercises at least twice a week. This is in addition to the moderate-intensity exercise. Spend less time sitting. Even light physical activity can be beneficial. Watch cholesterol and blood lipids Have your blood tested for lipids and cholesterol at 82 years of age, then have this test every 5 years. Have your cholesterol levels checked more often if: Your lipid or cholesterol levels are high. You are older than 83 years of age. You are at high risk for heart disease. What should I know about cancer screening? Depending on your health history and family history, you may need to have cancer screening at various ages. This may include screening for: Breast cancer. Cervical cancer. Colorectal cancer. Skin cancer. Lung cancer. What should I know about heart disease, diabetes, and high blood pressure? Blood pressure and heart disease High blood pressure causes heart disease and increases the risk of stroke. This is more likely to develop in people who have high blood pressure readings or are overweight. Have your blood pressure checked: Every 3-5 years if you are 2-69 years of age. Every year if you are 42 years old or older. Diabetes Have regular diabetes screenings. This checks your fasting blood sugar level. Have the screening done: Once every three years after age 83 if you are at a normal weight and have a low risk for diabetes. More often and at a younger age  if you are overweight or have a high risk for diabetes. What should I know about preventing infection? Hepatitis B If you have a higher risk for hepatitis B, you should be screened for this virus. Talk with your health care provider to find out if you are at risk for hepatitis B infection. Hepatitis C Testing is recommended for: Everyone born from [birth year] through 1965. Anyone with known risk factors for hepatitis C. Sexually transmitted infections (STIs) Get screened for STIs, including gonorrhea and  chlamydia, if: You are sexually active and are younger than 82 years of age. You are older than 82 years of age and your health care provider tells you that you are at risk for this type of infection. Your sexual activity has changed since you were last screened, and you are at increased risk for chlamydia or gonorrhea. Ask your health care provider if you are at risk. Ask your health care provider about whether you are at high risk for HIV. Your health care provider may recommend a prescription medicine to help prevent HIV infection. If you choose to take medicine to prevent HIV, you should first get tested for HIV. You should then be tested every 3 months for as long as you are taking the medicine. Pregnancy If you are about to stop having your period (premenopausal) and you may become pregnant, seek counseling before you get pregnant. Take 400 to 800 micrograms (mcg) of folic acid every day if you become pregnant. Ask for birth control (contraception) if you want to prevent pregnancy. Osteoporosis and menopause Osteoporosis is a disease in which the bones lose minerals and strength with aging. This can result in bone fractures. If you are 40 years old or older, or if you are at risk for osteoporosis and fractures, ask your health care provider if you should: Be screened for bone loss. Take a calcium or vitamin D supplement to lower your risk of fractures. Be given hormone replacement therapy (HRT) to treat symptoms of menopause. Follow these instructions at home: Alcohol use Do not drink alcohol if: Your health care provider tells you not to drink. You are pregnant, may be pregnant, or are planning to become pregnant. If you drink alcohol: Limit how much you have to: 0-1 drink a day. Know how much alcohol is in your drink. In the [place] one drink equals one 12 oz bottle of beer (355 mL), one 5 oz glass of wine (148 mL), or one 1 oz glass of hard liquor (44 mL). Lifestyle Do not use any  products that contain nicotine or tobacco. These products include cigarettes, chewing tobacco, and vaping devices, such as e-cigarettes. If you need help quitting, ask your health care provider. Do not use street drugs. Do not share needles. Ask your health care provider for help if you need support or information about quitting drugs. General instructions Schedule regular health, dental, and eye exams. Stay current with your vaccines. Tell your health care provider if: You often feel depressed. You have ever been abused or do not feel safe at home. Summary Adopting a healthy lifestyle and getting preventive care are important in promoting health and wellness. Follow your health care provider's instructions about healthy diet, exercising, and getting tested or screened for diseases. Follow your health care provider's instructions on monitoring your cholesterol and blood pressure. This information is not intended to replace advice given to you by your health care provider. Make sure you discuss any questions you have with your health care provider. Document  Revised: 02/04/2021 Document Reviewed: 02/04/2021 Elsevier Patient Education  2024 [employer].

## 2023-07-03 NOTE — Progress Notes (Signed)
Subjective:   Erica Patterson is a 82 y.o. female who presents for Medicare Annual (Subsequent) preventive examination.  Visit Complete: Virtual  I connected with  Erica Patterson on 07/03/23 by a audio enabled telemedicine application and verified that I am speaking with the correct person using two identifiers.  Patient Location: Home  Provider Location: Office/Clinic  I discussed the limitations of evaluation and management by telemedicine. The patient expressed understanding and agreed to proceed.  Patient Medicare AWV questionnaire was not completed by the patient .  Cardiac Risk Factors include: advanced age (>64men, >86 women)    Please see problem list for additional risk factors  Vital Signs: Because this visit was a virtual/telehealth visit, some criteria may be missing or patient reported. Any vitals not documented were not able to be obtained and vitals that have been documented are patient reported.   Objective:    Today's Vitals   07/03/23 1009  Weight: 120 lb (54.4 kg)  Height: 5\' 3"  (1.6 m)   Body mass index is 21.26 kg/m.     07/03/2023   10:16 AM 05/31/2021   10:21 AM  Advanced Directives  Does Patient Have a Medical Advance Directive? Yes Yes  Type of Estate agent of [place];Living will Living will;Healthcare Power of Attorney  Does patient want to make changes to medical advance directive?  No - Patient declined  Copy of Healthcare Power of Attorney in Chart?  No - copy requested    Current Medications (verified) Outpatient Encounter Medications as of 07/03/2023  Medication Sig   aspirin 81 MG tablet Take 81 mg by mouth daily.   Multiple Vitamins-Minerals (PRESERVISION AREDS) CAPS Take by mouth in the morning and at bedtime.   atorvastatin (LIPITOR) 10 MG tablet Take 1 tablet (10 mg total) by mouth daily. (Patient not taking: Reported on 07/03/2023)   Calcium Carbonate-Vitamin D (CALCIUM + D PO) Take by mouth 2 (two) times daily.  (Patient not taking: Reported on 12/10/2020)   citalopram (CELEXA) 10 MG tablet Take 1 tablet (10 mg total) by mouth daily.   No facility-administered encounter medications on file as of 07/03/2023.    Allergies (verified) Alendronate sodium   History: Past Medical History:  Diagnosis Date   CHICKENPOX, HX OF 06/12/2007   HEPATITIS NOS 06/12/2007   HYPERLIPIDEMIA 06/12/2007   OSTEOARTHRITIS 06/12/2007   OSTEOPENIA 05/24/2008   SKIN LESION 07/03/2009   UNSPECIFIED SUDDEN HEARING LOSS 07/03/2009   Past Surgical History:  Procedure Laterality Date   ABDOMINAL HYSTERECTOMY     breast biopsy     BREAST EXCISIONAL BIOPSY Bilateral    benign   OOPHORECTOMY     1 ovary   TONSILLECTOMY     Family History  Problem Relation Age of Onset   Coronary artery disease Other        female 1st degree relative, female 1st degree relative   Diabetes Other        1st degree relative   Hypertension Other    Breast cancer Cousin    Breast cancer Cousin    Breast cancer Cousin    Breast cancer Cousin    Social History   Socioeconomic History   Marital status: Widowed    Spouse name: Not on file   Number of children: 3   Years of education: Not on file   Highest education level: Not on file  Occupational History   Occupation: retired Runner, broadcasting/film/video [employer]    Employer: RETIRED  Tobacco Use  Smoking status: Never   Smokeless tobacco: Never  Substance and Sexual Activity   Alcohol use: No   Drug use: Never   Sexual activity: Not on file  Other Topics Concern   Not on file  Social History Narrative   Not on file   Social Determinants of Health   Financial Resource Strain: Low Risk  (07/03/2023)   Overall Financial Resource Strain (CARDIA)    Difficulty of Paying Living Expenses: Not hard at all  Food Insecurity: No Food Insecurity (07/03/2023)   Hunger Vital Sign    Worried About Running Out of Food in the Last Year: Never true    Ran Out of Food in the Last Year: Never true  Transportation  Needs: No Transportation Needs (07/03/2023)   PRAPARE - Administrator, Civil Service (Medical): No    Lack of Transportation (Non-Medical): No  Physical Activity: Insufficiently Active (07/03/2023)   Exercise Vital Sign    Days of Exercise per Week: 4 days    Minutes of Exercise per Session: 30 min  Stress: No Stress Concern Present (07/03/2023)   [employer] of Occupational Health - Occupational Stress Questionnaire    Feeling of Stress : Not at all  Social Connections: Socially Isolated (07/03/2023)   Social Connection and Isolation Panel [NHANES]    Frequency of Communication with Friends and Family: More than three times a week    Frequency of Social Gatherings with Friends and Family: More than three times a week    Attends Religious Services: Never    Database administrator or Organizations: No    Attends Banker Meetings: Never    Marital Status: Widowed    Tobacco Counseling Counseling given: Not Answered   Clinical Intake:  Pre-visit preparation completed: Yes  Pain : No/denies pain     Nutritional Status: BMI of 19-24  Normal Nutritional Risks: None Diabetes: No  How often do you need to have someone help you when you read instructions, pamphlets, or other written materials from your doctor or pharmacy?: 1 - Never What is the last grade level you completed in school?: 4 year college  Interpreter Needed?: No      Activities of Daily Living    07/03/2023   10:10 AM  In your present state of health, do you have any difficulty performing the following activities:  Hearing? 1  Vision? 0  Difficulty concentrating or making decisions? 0  Walking or climbing stairs? 0  Dressing or bathing? 0  Doing errands, shopping? 0  Preparing Food and eating ? N  Using the Toilet? N  In the past six months, have you accidently leaked urine? N  Do you have problems with loss of bowel control? N  Managing your Medications? N  Managing your  Finances? N  Housekeeping or managing your Housekeeping? N    Patient Care Team: Corwin Levins, MD as PCP - General Maris Berger, MD as Consulting Physician (Ophthalmology)  Indicate any recent Medical Services you may have received from other than [facility] providers in the past year (date may be approximate).     Assessment:   This is a routine wellness examination for [place].  Hearing/Vision screen Vision Screening - Comments:: Annual eye exam Dr. Lupita Shutter    Goals Addressed   None   Depression Screen    07/03/2023   10:15 AM 09/06/2021   10:55 AM 09/06/2021   10:15 AM 05/31/2021   10:19 AM 09/04/2020    2:13 PM  09/04/2020    1:31 PM 08/23/2019    2:31 PM  PHQ 2/9 Scores  PHQ - 2 Score 0 2 1 1 1  0 1    Fall Risk    07/03/2023   10:12 AM 09/06/2021   10:55 AM 09/06/2021   10:15 AM 05/31/2021   10:09 AM 09/04/2020    2:13 PM  Fall Risk   Falls in the past year? 0 0 0 0 1  Number falls in past yr: 0 0 0 0 0  Injury with Fall? 0 0 0 0 0  Risk for fall due to : No Fall Risks   No Fall Risks   Follow up Falls evaluation completed   Falls evaluation completed     MEDICARE RISK AT HOME: Medicare Risk at Home Any stairs in or around the home?: No If so, are there any without handrails?: No Home free of loose throw rugs in walkways, pet beds, electrical cords, etc?: Yes Adequate lighting in your home to reduce risk of falls?: Yes Life alert?: No Use of a cane, walker or w/c?: No Grab bars in the bathroom?: No Shower chair or bench in shower?: No Elevated toilet seat or a handicapped toilet?: No  TIMED UP AND GO:  Was the test performed?  No    Cognitive Function:        07/03/2023   10:16 AM  6CIT Screen  What Year? 0 points  What month? 0 points  What time? 0 points  Count back from 20 0 points  Months in reverse 0 points  Repeat phrase 0 points  Total Score 0 points    Immunizations Immunization History  Administered Date(s) Administered   Fluad Quad(high  Dose 65+) 06/13/2019, 06/19/2022, 06/30/2023   Influenza Split 07/17/2011, 06/25/2012   Influenza Whole 07/03/2009   Influenza, High Dose Seasonal PF 07/06/2014, 07/23/2017, 07/01/2018, 06/27/2021   Influenza,inj,Quad PF,6+ Mos 06/30/2013, 07/12/2015   Influenza-Unspecified 07/02/2016, 07/06/2020   PFIZER(Purple Top)SARS-COV-2 Vaccination 10/10/2019, 10/31/2019, 07/06/2020, 06/27/2021   Pneumococcal Conjugate-13 06/30/2013   Pneumococcal Polysaccharide-23 03/29/2006, 05/05/2011   Respiratory Syncytial Virus Vaccine,Recomb Aduvanted(Arexvy) 06/24/2022   Td 09/29/2001   Tdap 06/25/2012   Unspecified SARS-COV-2 Vaccination 07/04/2022   Zoster Recombinant(Shingrix) 07/04/2019, 09/02/2019    TDAP status: Due, Education has been provided regarding the importance of this vaccine. Advised may receive this vaccine at local pharmacy or Health Dept. Aware to provide a copy of the vaccination record if obtained from local pharmacy or Health Dept. Verbalized acceptance and understanding.  Flu Vaccine status: Up to date  Pneumococcal vaccine status: Up to date  Covid-19 vaccine status: Completed vaccines  Qualifies for Shingles Vaccine? Yes   Zostavax completed Yes   Shingrix Completed?: Yes  Screening Tests Health Maintenance  Topic Date Due   DTaP/Tdap/Td (3 - Td or Tdap) 06/25/2022   COVID-19 Vaccine (6 - 2023-24 season) 05/31/2023   Medicare Annual Wellness (AWV)  07/02/2024   Pneumonia Vaccine 78+ Years old  Completed   INFLUENZA VACCINE  Completed   DEXA SCAN  Completed   Zoster Vaccines- Shingrix  Completed   HPV VACCINES  Aged Out   Hepatitis C Screening  Discontinued    Health Maintenance  Health Maintenance Due  Topic Date Due   DTaP/Tdap/Td (3 - Td or Tdap) 06/25/2022   COVID-19 Vaccine (6 - 2023-24 season) 05/31/2023    Colorectal cancer screening: No longer required.   Mammogram status: No longer required due to aged out .  Bone Density status: Completed  10/20/2017. Results reflect: Bone density results: OSTEOPOROSIS. Repeat every 2 years.  Lung Cancer Screening: (Low Dose CT Chest recommended if Age 97-80 years, 20 pack-year currently smoking OR have quit w/in 15years.) does not qualify.   Lung Cancer Screening Referral: n/a  Additional Screening:  Hepatitis C Screening: does qualify; Completed [id number]  Vision Screening: Recommended annual ophthalmology exams for early detection of glaucoma and other disorders of the eye. Is the patient up to date with their annual eye exam?  Yes  Who is the provider or what is the name of the office in which the patient attends annual eye exams? Dr. Elvera Maria If pt is not established with a provider, would they like to be referred to a provider to establish care? No .   Dental Screening: Recommended annual dental exams for proper oral hygiene  Diabetic Foot Exam: patient is not a diabetic  Community Resource Referral / Chronic Care Management: CRR required this visit?  No   CCM required this visit?  No     Plan:     I have personally reviewed and noted the following in the patient's chart:   Medical and social history Use of alcohol, tobacco or illicit drugs  Current medications and supplements including opioid prescriptions. Patient is not currently taking opioid prescriptions. Functional ability and status Nutritional status Physical activity Advanced directives List of other physicians Hospitalizations, surgeries, and ER visits in previous 12 months Vitals Screenings to include cognitive, depression, and falls Referrals and appointments  In addition, I have reviewed and discussed with patient certain preventive protocols, quality metrics, and best practice recommendations. A written personalized care plan for preventive services as well as general preventive health recommendations were provided to patient.     Tamela Oddi, CMA   07/03/2023   Non face to face 30 minutes    After Visit Summary: (Mail) Due to this being a telephonic visit, the after visit summary with patients personalized plan was offered to patient via mail   Nurse Notes:  Ms. Peasley , Thank you for taking time to come for your Medicare Wellness Visit. I appreciate your ongoing commitment to your health goals. Please review the following plan we discussed and let me know if I can assist you in the future.   These are the goals we discussed:  Goals       Patient Stated (pt-stated)      My goal is to get back to walking 2 miles daily for 5 days.        This is a list of the screening recommended for you and due dates:  Health Maintenance  Topic Date Due   DTaP/Tdap/Td vaccine (3 - Td or Tdap) 06/25/2022   COVID-19 Vaccine (6 - 2023-24 season) 05/31/2023   Medicare Annual Wellness Visit  07/02/2024   Pneumonia Vaccine  Completed   Flu Shot  Completed   DEXA scan (bone density measurement)  Completed   Zoster (Shingles) Vaccine  Completed   HPV Vaccine  Aged Out   Hepatitis C Screening  Discontinued

## 2023-08-06 DIAGNOSIS — M810 Age-related osteoporosis without current pathological fracture: Secondary | ICD-10-CM | POA: Diagnosis not present

## 2023-08-06 DIAGNOSIS — R002 Palpitations: Secondary | ICD-10-CM | POA: Diagnosis not present

## 2023-08-06 DIAGNOSIS — G459 Transient cerebral ischemic attack, unspecified: Secondary | ICD-10-CM | POA: Diagnosis not present

## 2023-08-06 DIAGNOSIS — G2581 Restless legs syndrome: Secondary | ICD-10-CM | POA: Diagnosis not present

## 2023-08-06 DIAGNOSIS — R5383 Other fatigue: Secondary | ICD-10-CM | POA: Diagnosis not present

## 2023-08-06 DIAGNOSIS — F32A Depression, unspecified: Secondary | ICD-10-CM | POA: Diagnosis not present

## 2023-08-06 DIAGNOSIS — E611 Iron deficiency: Secondary | ICD-10-CM | POA: Diagnosis not present

## 2023-08-06 DIAGNOSIS — H9190 Unspecified hearing loss, unspecified ear: Secondary | ICD-10-CM | POA: Diagnosis not present

## 2023-08-12 DIAGNOSIS — R002 Palpitations: Secondary | ICD-10-CM | POA: Diagnosis not present

## 2023-09-09 DIAGNOSIS — H903 Sensorineural hearing loss, bilateral: Secondary | ICD-10-CM | POA: Diagnosis not present

## 2023-09-17 DIAGNOSIS — F32A Depression, unspecified: Secondary | ICD-10-CM | POA: Diagnosis not present

## 2023-09-17 DIAGNOSIS — M810 Age-related osteoporosis without current pathological fracture: Secondary | ICD-10-CM | POA: Diagnosis not present

## 2023-09-17 DIAGNOSIS — G459 Transient cerebral ischemic attack, unspecified: Secondary | ICD-10-CM | POA: Diagnosis not present

## 2023-09-17 DIAGNOSIS — G2581 Restless legs syndrome: Secondary | ICD-10-CM | POA: Diagnosis not present

## 2023-11-05 DIAGNOSIS — Z780 Asymptomatic menopausal state: Secondary | ICD-10-CM | POA: Diagnosis not present

## 2023-11-05 DIAGNOSIS — Z23 Encounter for immunization: Secondary | ICD-10-CM | POA: Diagnosis not present

## 2023-11-05 DIAGNOSIS — M810 Age-related osteoporosis without current pathological fracture: Secondary | ICD-10-CM | POA: Diagnosis not present

## 2023-11-05 DIAGNOSIS — E785 Hyperlipidemia, unspecified: Secondary | ICD-10-CM | POA: Diagnosis not present

## 2023-11-05 DIAGNOSIS — G459 Transient cerebral ischemic attack, unspecified: Secondary | ICD-10-CM | POA: Diagnosis not present

## 2023-11-05 DIAGNOSIS — F32A Depression, unspecified: Secondary | ICD-10-CM | POA: Diagnosis not present

## 2024-03-01 DIAGNOSIS — M810 Age-related osteoporosis without current pathological fracture: Secondary | ICD-10-CM | POA: Diagnosis not present

## 2024-05-12 DIAGNOSIS — Z780 Asymptomatic menopausal state: Secondary | ICD-10-CM | POA: Diagnosis not present

## 2024-05-12 DIAGNOSIS — M810 Age-related osteoporosis without current pathological fracture: Secondary | ICD-10-CM | POA: Diagnosis not present

## 2024-05-19 DIAGNOSIS — M810 Age-related osteoporosis without current pathological fracture: Secondary | ICD-10-CM | POA: Diagnosis not present

## 2024-05-19 DIAGNOSIS — F32A Depression, unspecified: Secondary | ICD-10-CM | POA: Diagnosis not present

## 2024-05-19 DIAGNOSIS — R5383 Other fatigue: Secondary | ICD-10-CM | POA: Diagnosis not present

## 2024-07-06 DIAGNOSIS — H3530 Unspecified macular degeneration: Secondary | ICD-10-CM | POA: Diagnosis not present

## 2024-07-06 DIAGNOSIS — Z833 Family history of diabetes mellitus: Secondary | ICD-10-CM | POA: Diagnosis not present

## 2024-07-06 DIAGNOSIS — F320 Major depressive disorder, single episode, mild: Secondary | ICD-10-CM | POA: Diagnosis not present

## 2024-07-06 DIAGNOSIS — R030 Elevated blood-pressure reading, without diagnosis of hypertension: Secondary | ICD-10-CM | POA: Diagnosis not present

## 2024-07-06 DIAGNOSIS — Z8249 Family history of ischemic heart disease and other diseases of the circulatory system: Secondary | ICD-10-CM | POA: Diagnosis not present

## 2024-07-06 DIAGNOSIS — Z634 Disappearance and death of family member: Secondary | ICD-10-CM | POA: Diagnosis not present

## 2024-07-06 DIAGNOSIS — Z823 Family history of stroke: Secondary | ICD-10-CM | POA: Diagnosis not present

## 2024-07-06 DIAGNOSIS — M810 Age-related osteoporosis without current pathological fracture: Secondary | ICD-10-CM | POA: Diagnosis not present

## 2024-07-06 DIAGNOSIS — Z7982 Long term (current) use of aspirin: Secondary | ICD-10-CM | POA: Diagnosis not present

## 2024-08-19 DIAGNOSIS — F32A Depression, unspecified: Secondary | ICD-10-CM | POA: Diagnosis not present

## 2024-08-19 DIAGNOSIS — E785 Hyperlipidemia, unspecified: Secondary | ICD-10-CM | POA: Diagnosis not present

## 2024-08-19 DIAGNOSIS — R030 Elevated blood-pressure reading, without diagnosis of hypertension: Secondary | ICD-10-CM | POA: Diagnosis not present

## 2024-08-19 DIAGNOSIS — M810 Age-related osteoporosis without current pathological fracture: Secondary | ICD-10-CM | POA: Diagnosis not present
